# Patient Record
Sex: Female | Born: 1970 | Race: White | Hispanic: No | Marital: Married | Smoking: Current every day smoker
Health system: Southern US, Community
[De-identification: ages and names within clinical notes are randomized; demographics above are authoritative.]

## PROBLEM LIST (undated history)

## (undated) DIAGNOSIS — M797 Fibromyalgia: Secondary | ICD-10-CM

## (undated) DIAGNOSIS — F3181 Bipolar II disorder: Secondary | ICD-10-CM

## (undated) DIAGNOSIS — J381 Polyp of vocal cord and larynx: Secondary | ICD-10-CM

## (undated) HISTORY — DX: Polyp of vocal cord and larynx: J38.1

## (undated) HISTORY — DX: Bipolar II disorder: F31.81

---

## 2010-05-09 ENCOUNTER — Encounter: Payer: Self-pay | Admitting: Emergency Medicine

## 2010-05-09 ENCOUNTER — Inpatient Hospital Stay (INDEPENDENT_AMBULATORY_CARE_PROVIDER_SITE_OTHER)
Admission: RE | Admit: 2010-05-09 | Discharge: 2010-05-09 | Disposition: A | Discharge status: Home / Self Care | Payer: Self-pay | Source: Ambulatory Visit | Attending: Emergency Medicine | Admitting: Emergency Medicine

## 2010-05-09 LAB — CONVERTED CEMR LAB: Rapid Strep: NEGATIVE

## 2010-05-13 NOTE — Assessment & Plan Note (Signed)
Summary: SORE THROAT/   Vital Signs:  Patient Profile:   40 Years Old Female CC:      sore throat, HA, ear pain x last night Height:     64.5 inches Weight:      152 pounds O2 Sat:      100 % O2 treatment:    Room Air Temp:     98.4 degrees F oral Pulse rate:   92 / minute Resp:     18 per minute BP sitting:   130 / 84  (left arm) Cuff size:   regular  Vitals Entered By: Lajean Saver RN (May 09, 2010 3:49 PM)                  Updated Prior Medication List: TRILEPTAL 600 MG TABS (OXCARBAZEPINE) once daily TOPAMAX 50 MG TABS (TOPIRAMATE)  TEMAZEPAM 15 MG CAPS (TEMAZEPAM) once daily CYMBALTA 60 MG CPEP (DULOXETINE HCL) once daily FLONASE 50 MCG/ACT SUSP (FLUTICASONE PROPIONATE)  PREDNISONE 20 MG TABS (PREDNISONE) 2 days left in dose  Current Allergies: ! COMPAZINE ! SULFAHistory of Present Illness Chief Complaint: sore throat, HA, ear pain x last night History of Present Illness: Pt complains of 1 days of sore throat, HA, L ear pain. Slight colored rhinnorrhea. Was tx'd w 10 days of amox for sinusitis, improved, finished abx 1 week ago. Her co-worker and son have + strep. No cough, No dyspnea. No chest pain. No wheezing.  No nausea No vomiting. ? + fever, No chills   REVIEW OF SYSTEMS Constitutional Symptoms      Denies fever, chills, night sweats, weight loss, weight gain, and fatigue.  Eyes       Denies change in vision, eye pain, eye discharge, glasses, contact lenses, and eye surgery. Ear/Nose/Throat/Mouth       Complains of ear pain and sore throat.      Denies hearing loss/aids, change in hearing, ear discharge, dizziness, frequent runny nose, frequent nose bleeds, sinus problems, hoarseness, and tooth pain or bleeding.  Respiratory       Denies dry cough, productive cough, wheezing, shortness of breath, asthma, bronchitis, and emphysema/COPD.  Cardiovascular       Denies murmurs, chest pain, and tires easily with exhertion.    Gastrointestinal  Denies stomach pain, nausea/vomiting, diarrhea, constipation, blood in bowel movements, and indigestion. Genitourniary       Denies painful urination, kidney stones, and loss of urinary control. Neurological       Complains of headaches.      Denies paralysis, seizures, and fainting/blackouts. Musculoskeletal       Denies muscle pain, joint pain, joint stiffness, decreased range of motion, redness, swelling, muscle weakness, and gout.  Skin       Denies bruising, unusual mles/lumps or sores, and hair/skin or nail changes.  Psych       Denies mood changes, temper/anger issues, anxiety/stress, speech problems, depression, and sleep problems. Other Comments: recently recovered from sinus infection, finished antibiotics. C- worker and son with strep   Past History:  Past Medical History: ?pinched nerver in neck bi-polar disorder  Past Surgical History: Tonsillectomy  Family History: Family History Hypertension- mother  Social History: Current Smoker 1/3 PPD Alcohol use-yes 1/week Drug use-no Smoking Status:  current Drug Use:  no Physical Exam General appearance: fatigued, alert, nad Head: normocephalic, atraumatic. Mild R maxillary ttp. Eyes: conjunctivae and lids normal Ears: normal, no lesions or deformities Nasal: swollen red turbinates with congestion. mild deviated septum. mild seromucous d/c. Oral/Pharynx: pharyngeal  erythema without exudate, uvula midline without deviation Neck: supple,anterior lymphadenopathy present Chest/Lungs: no rales, wheezes, or rhonchi bilateral, breath sounds equal without effort Heart: regular rate and  rhythm, no murmur Skin: no obvious rashes or lesions Assessment New Problems: UPPER RESPIRATORY INFECTION, ACUTE (ICD-465.9) PHARYNGITIS, ACUTE (ICD-462.0)   Patient Education: Patient and/or caregiver instructed in the following: rest, fluids, Tylenol prn.  Plan New Medications/Changes: AUGMENTIN 875-125 MG TABS (AMOXICILLIN-POT  CLAVULANATE) 1 by mouth two times a day x 10 days  #20 x 0, 05/09/2010, Lajean Manes MD  Planning Comments:   Discussed that this could be viral cause. Pt declined sending off throat cx. Risks, benefits, alternatives discussed. Pt voiced understanding and agreement. --I wrote for augmentin rx, to fill if not improving with sxs care in 3 days. (fill sooner if spiking fever of worsening colored rhinorrhea)  Follow Up: Follow up in 2-3 days if no improvement, Follow up with Primary Physician  The patient and/or caregiver has been counseled thoroughly with regard to medications prescribed including dosage, schedule, interactions, rationale for use, and possible side effects and they verbalize understanding.  Diagnoses and expected course of recovery discussed and will return if not improved as expected or if the condition worsens. Patient and/or caregiver verbalized understanding.  Prescriptions: AUGMENTIN 875-125 MG TABS (AMOXICILLIN-POT CLAVULANATE) 1 by mouth two times a day x 10 days  #20 x 0   Entered and Authorized by:   Lajean Manes MD   Signed by:   Lajean Manes MD on 05/09/2010   Method used:   Handwritten   RxID:   [id number]     Laboratory Results  Date/Time Received: May 09, 2010 3:56 PM  Date/Time Reported: May 09, 2010 3:56 PM   Other Tests  Rapid Strep: negative  Kit Test Internal QC: Negative   (Normal Range: Negative)

## 2010-05-16 ENCOUNTER — Telehealth (INDEPENDENT_AMBULATORY_CARE_PROVIDER_SITE_OTHER): Payer: Self-pay | Admitting: *Deleted

## 2010-05-22 NOTE — Progress Notes (Signed)
  Phone Note Outgoing Call Call back at [facility] Phone [phone]   Call placed by: Emilio Math,  May 16, 2010 2:05 PM Call placed to: Patient Summary of Call: Left msg, hope you're feeling better appreciate you coming in.

## 2010-06-30 ENCOUNTER — Encounter: Payer: Self-pay | Admitting: Emergency Medicine

## 2010-06-30 ENCOUNTER — Inpatient Hospital Stay (INDEPENDENT_AMBULATORY_CARE_PROVIDER_SITE_OTHER)
Admission: RE | Admit: 2010-06-30 | Discharge: 2010-06-30 | Disposition: A | Discharge status: Home / Self Care | Payer: Self-pay | Source: Ambulatory Visit | Attending: Emergency Medicine | Admitting: Emergency Medicine

## 2010-06-30 DIAGNOSIS — H60399 Other infective otitis externa, unspecified ear: Secondary | ICD-10-CM

## 2010-07-08 ENCOUNTER — Telehealth (INDEPENDENT_AMBULATORY_CARE_PROVIDER_SITE_OTHER): Payer: Self-pay | Admitting: *Deleted

## 2010-07-10 ENCOUNTER — Telehealth (INDEPENDENT_AMBULATORY_CARE_PROVIDER_SITE_OTHER): Payer: Self-pay | Admitting: *Deleted

## 2010-09-09 ENCOUNTER — Inpatient Hospital Stay (INDEPENDENT_AMBULATORY_CARE_PROVIDER_SITE_OTHER)
Admission: RE | Admit: 2010-09-09 | Discharge: 2010-09-09 | Disposition: A | Discharge status: Home / Self Care | Payer: Self-pay | Source: Ambulatory Visit | Attending: Emergency Medicine | Admitting: Emergency Medicine

## 2010-09-09 ENCOUNTER — Encounter: Payer: Self-pay | Admitting: Emergency Medicine

## 2010-09-09 DIAGNOSIS — B370 Candidal stomatitis: Secondary | ICD-10-CM | POA: Insufficient documentation

## 2010-11-18 ENCOUNTER — Inpatient Hospital Stay (INDEPENDENT_AMBULATORY_CARE_PROVIDER_SITE_OTHER)
Admission: RE | Admit: 2010-11-18 | Discharge: 2010-11-18 | Disposition: A | Discharge status: Home / Self Care | Payer: Self-pay | Source: Ambulatory Visit | Attending: Family Medicine | Admitting: Family Medicine

## 2010-11-18 ENCOUNTER — Encounter: Payer: Self-pay | Admitting: Family Medicine

## 2010-11-18 DIAGNOSIS — J0100 Acute maxillary sinusitis, unspecified: Secondary | ICD-10-CM

## 2010-11-23 ENCOUNTER — Telehealth (INDEPENDENT_AMBULATORY_CARE_PROVIDER_SITE_OTHER): Payer: Self-pay | Admitting: Emergency Medicine

## 2011-01-26 NOTE — Telephone Encounter (Signed)
  Phone Note Call from Patient   Caller: Patient Summary of Call: Patient reports she took the Diflucan and is still having no relief. One additional dose of Diflucan and one tube of Nystatin cream, per Dr. Orson Aloe, called into [facility] in [place]. Initial call taken by: Lajean Saver RN,  Jul 10, 2010 8:24 AM

## 2011-01-26 NOTE — Progress Notes (Signed)
Summary: thrush   Vital Signs:  Patient Profile:   40 Years Old Female CC:      thrush x 1 day Height:     64.5 inches Weight:      142 pounds O2 Sat:      99 % O2 treatment:    Room Air Temp:     98.4 degrees F oral Pulse rate:   81 / minute Pulse rhythm:   regular Resp:     16 per minute BP sitting:   113 / 73  (left arm) Cuff size:   regular  Vitals Entered By: Emilio Math (September 09, 2010 4:37 PM)                  Current Allergies (reviewed today): ! COMPAZINE ! SULFA ! * LAMICTALHistory of Present Illness History from: patient Chief Complaint: thrush History of Present Illness: She had thrush 2 months ago that was difficult to treat and she had to take 2 rounds of Nystatin. It started after she was placed on an inhaled steroid a few months ago.  She reports being completely healthy (other than bipolar) with no immunodeficiencies.  She has white spots on her tongue and inside her mouth and a sore throat.  She feels that her throat and stomach are affected and she would like antifungal treatment, but doesn't want Nystatin again because it doesn't work well with her.  She is uninsured and states that she cannot afford to see an ENT right now.  No F/C/N/V or other symptoms.  She has cut out sugar from her diet and doesn't want any meds that have sugar in it.  REVIEW OF SYSTEMS Constitutional Symptoms      Denies fever, chills, night sweats, weight loss, weight gain, and fatigue.  Eyes       Denies change in vision, eye pain, eye discharge, glasses, contact lenses, and eye surgery. Ear/Nose/Throat/Mouth       Complains of sore throat.      Denies hearing loss/aids, change in hearing, ear pain, ear discharge, dizziness, frequent runny nose, frequent nose bleeds, sinus problems, hoarseness, and tooth pain or bleeding.  Respiratory       Denies dry cough, productive cough, wheezing, shortness of breath, asthma, bronchitis, and emphysema/COPD.  Cardiovascular       Denies  murmurs, chest pain, and tires easily with exhertion.    Gastrointestinal       Denies stomach pain, nausea/vomiting, diarrhea, constipation, blood in bowel movements, and indigestion. Genitourniary       Denies painful urination, kidney stones, and loss of urinary control. Neurological       Denies paralysis, seizures, and fainting/blackouts. Musculoskeletal       Denies muscle pain, joint pain, joint stiffness, decreased range of motion, redness, swelling, muscle weakness, and gout.  Skin       Denies bruising, unusual mles/lumps or sores, and hair/skin or nail changes.  Psych       Denies mood changes, temper/anger issues, anxiety/stress, speech problems, depression, and sleep problems.  Past History:  Past Medical History: Reviewed history from 05/09/2010 and no changes required. ?pinched nerver in neck bi-polar disorder  Past Surgical History: Reviewed history from 05/09/2010 and no changes required. Tonsillectomy  Family History: Reviewed history from 05/09/2010 and no changes required. Family History Hypertension- mother  Social History: Reviewed history from 05/09/2010 and no changes required. Current Smoker 1/3 PPD Alcohol use-yes 1/week Drug use-no Physical Exam General appearance: well developed, well nourished, no acute  distress Ears: normal, no lesions or deformities Nasal: mucosa pink, nonedematous, no septal deviation, turbinates normal Oral/Pharynx: OP is normal with no erythema or exudate or swelling, she does have a white coating on the top of her tongue but no tongue swelling Neck: neck supple,  trachea midline, no masses Chest/Lungs: no rales, wheezes, or rhonchi bilateral, breath sounds equal without effort Heart: regular rate and  rhythm, no murmur MSE: oriented to time, place, and person Assessment New Problems: THRUSH (ICD-112.0)   Plan New Medications/Changes: FLUCONAZOLE IN SODIUM CHLORIDE 200-0.9 MG/100ML-% SOLN (FLUCONAZOLE IN SODIUM  CHLORIDE) 200mg  on day 1, then 100mg  daily for 3 weeks  #QS x 0, 09/09/2010, Hoyt Koch MD  New Orders: Est. Patient Level II [phone] Planning Comments:   I have educated the patient that this may or may not be thrush, but if so, the question of why she is getting it is important.  I would like her to see her PCP and consider labwork (for underlying medical problems and liver function) since they can likely manage this better or discussing with an ID physician if she is so inclined.  I have Rx Diflucan suspension that she can swish and spit for a few weeks.  She has also voiced that she may want to go see a wellness naturopath that can help her with diet or other remedies.    DDx includes many other viral infections, etc.    The patient and/or caregiver has been counseled thoroughly with regard to medications prescribed including dosage, schedule, interactions, rationale for use, and possible side effects and they verbalize understanding.  Diagnoses and expected course of recovery discussed and will return if not improved as expected or if the condition worsens. Patient and/or caregiver verbalized understanding.  Prescriptions: FLUCONAZOLE IN SODIUM CHLORIDE 200-0.9 MG/100ML-% SOLN (FLUCONAZOLE IN SODIUM CHLORIDE) 200mg  on day 1, then 100mg  daily for 3 weeks  #QS x 0   Entered and Authorized by:   Hoyt Koch MD   Signed by:   Hoyt Koch MD on 09/09/2010   Method used:   Print then Give to Patient   RxID:   [id number]   Orders Added: 1)  Est. Patient Level II [08657]

## 2011-01-26 NOTE — Progress Notes (Signed)
Summary: possible sinus infection rm 4   Vital Signs:  Patient Profile:   40 Years Old Female CC:      sinus problems x 9 days  Height:     64.5 inches Weight:      139.50 pounds O2 Sat:      100 % O2 treatment:    Room Air Temp:     98.2 degrees F oral Pulse rate:   74 / minute Resp:     16 per minute BP sitting:   108 / 72  (left arm) Cuff size:   regular  Vitals Entered By: Clemens Catholic LPN (November 18, 2010 4:05 PM)                  Updated Prior Medication List: TRILEPTAL 600 MG TABS (OXCARBAZEPINE) once daily TOPAMAX 50 MG TABS (TOPIRAMATE)  TEMAZEPAM 15 MG CAPS (TEMAZEPAM) once daily CYMBALTA 60 MG CPEP (DULOXETINE HCL) once daily FLONASE 50 MCG/ACT SUSP (FLUTICASONE PROPIONATE)   Current Allergies (reviewed today): ! COMPAZINE ! SULFA ! * LAMICTALHistory of Present Illness Chief Complaint: sinus problems x 9 days  History of Present Illness:  Subjective:  Patient complains of a history of recurring right side sinus infections, usually occuring about once or twice a year.  Last infection about 9 months ago.  She has now had 8 day history of constant right facial pressure and nasal congestion.  No fevers, chills, and sweats.  No recent URI symptoms.  + allergy to mold.  She had a brief left nosebleed today. She has been using saline irrigation with a neti pot, Sudafed, antihistamines, and started back on Flonase about 4 days ago.  She has also used Afrin at bedtime.  REVIEW OF SYSTEMS Constitutional Symptoms       Complains of fatigue.     Denies fever, chills, night sweats, weight loss, and weight gain.  Eyes       Complains of eye pain and eye drainage.      Denies change in vision, glasses, contact lenses, and eye surgery. Ear/Nose/Throat/Mouth       Complains of frequent runny nose and sinus problems.      Denies hearing loss/aids, change in hearing, ear pain, ear discharge, dizziness, frequent nose bleeds, sore throat, hoarseness, and tooth pain or  bleeding.  Respiratory       Denies dry cough, productive cough, wheezing, shortness of breath, asthma, bronchitis, and emphysema/COPD.  Cardiovascular       Denies murmurs, chest pain, and tires easily with exhertion.    Gastrointestinal       Denies stomach pain, nausea/vomiting, diarrhea, constipation, blood in bowel movements, and indigestion. Genitourniary       Denies painful urination, kidney stones, and loss of urinary control. Neurological       Complains of headaches.      Denies paralysis, seizures, and fainting/blackouts. Musculoskeletal       Denies muscle pain, joint pain, joint stiffness, decreased range of motion, redness, swelling, muscle weakness, and gout.  Skin       Denies bruising, unusual mles/lumps or sores, and hair/skin or nail changes.  Psych       Denies mood changes, temper/anger issues, anxiety/stress, speech problems, depression, and sleep problems. Other Comments: pt c/o sinus problems, HA, RT sided head pressure, and fatigue x 9 days. she has a hx of sinusitis. she has tried Coca Cola pot, Mucinex, Sudafed, and Benadryl with no relief.   Past History:  Past Medical History: Reviewed history  from 05/09/2010 and no changes required. ?pinched nerver in neck bi-polar disorder  Past Surgical History: Reviewed history from 05/09/2010 and no changes required. Tonsillectomy  Family History: Reviewed history from 05/09/2010 and no changes required. Family History Hypertension- mother  Social History: Reviewed history from 05/09/2010 and no changes required. Current Smoker 1/3 PPD Alcohol use-yes 1/week Drug use-no   Objective:  Appearance:  Patient appears healthy, stated age, and in no acute distress  Eyes:  Pupils are equal, round, and reactive to light and accomodation.  Extraocular movement is intact.  Conjunctivae are not inflamed.  Ears:  Canals normal.  Tympanic membranes normal.   Nose:  Moderately congested turbinates.  No sinus tenderness  however Pharynx:  Normal  Neck:  Supple.  Slightly tender shotty anterior/posterior nodes are palpated bilaterally.  Lungs:  Clear to auscultation.  Breath sounds are equal.  Heart:  Regular rate and rhythm without murmurs, rubs, or gallops.   Assessment New Problems: SINUSITIS, MAXILLARY, ACUTE (ICD-461.0)  ? SINUSITIS  Plan New Medications/Changes: DOXYCYCLINE HYCLATE 100 MG CAPS (DOXYCYCLINE HYCLATE) One by mouth two times a day  #28 x 0, 11/18/2010, Donna Christen MD  New Orders: Est. Patient Level III [phone] Planning Comments:   Patient declines sinus x-rays (no insurance).  She also prefers not to take oral steroid. Begin doxycycline for 10 to 14 days, expectorant/decongestant.  May use Afrin once or twice daily followed by saline irrigation, then Flonase once daily.  Increase fluid intake Followup with ENT if not improving 10 to 14 days.   The patient and/or caregiver has been counseled thoroughly with regard to medications prescribed including dosage, schedule, interactions, rationale for use, and possible side effects and they verbalize understanding.  Diagnoses and expected course of recovery discussed and will return if not improved as expected or if the condition worsens. Patient and/or caregiver verbalized understanding.  Prescriptions: DOXYCYCLINE HYCLATE 100 MG CAPS (DOXYCYCLINE HYCLATE) One by mouth two times a day  #28 x 0   Entered and Authorized by:   Donna Christen MD   Signed by:   Donna Christen MD on 11/18/2010   Method used:   Print then Give to Patient   RxID:   [id number]   Patient Instructions: 1)  Take Mucinex  (guaifenesin) twice daily for congestion with Sudafed. 2)  Increase fluid intake  3)  May use Afrin nasal spray (or generic oxymetazoline) twice daily for about 5 days.  Also recommend using saline nasal spray several times daily and/or saline nasal irrigation.  Continue Flonase. 4)  Followup with ENT physician if not improving 10 to 14 days.    Orders Added: 1)  Est. Patient Level III [[id number]]

## 2011-01-26 NOTE — Telephone Encounter (Signed)
  Phone Note Call from Patient   Caller: Patient Summary of Call: Called to say S&S of sinus infx improving, but wondering if RX for Doxycycline could be causing her extreme weakness, shakiness, pain in hips and low back? Reviewed with Dr.Beese who advises quit RX; if no improvement after 24 hours, seek care in ER. Pt. understands. Initial call taken by: Lavell Islam RN,  November 23, 2010 2:53 PM

## 2011-01-26 NOTE — Telephone Encounter (Signed)
  Phone Note Call from Patient Call back at Home Phone [phone]   Caller: Patient Call For: Rx Summary of Call: Patient developed a yeast infection (3 days) from the antibiotic she has been taking. She has tried vagisil without relief. She would like a Rx for Diflucan. [facility] [place].  Initial call taken by: Lajean Saver RN,  Jul 08, 2010 9:25 AM    New/Updated Medications: DIFLUCAN 150 MG TABS (FLUCONAZOLE) One by mouth as a single dose Prescriptions: DIFLUCAN 150 MG TABS (FLUCONAZOLE) One by mouth as a single dose  #1 x 0   Entered and Authorized by:   Donna Christen MD   Signed by:   Donna Christen MD on 07/08/2010   Method used:   Electronically to        [facility] [address] [phone]* (retail)       [address]       [place], [place]  [address]       Ph: [phone]       Fax: [phone]   RxID:   [id number]  Rx sent Donna Christen MD  Jul 08, 2010 9:57 AM

## 2011-01-26 NOTE — Progress Notes (Signed)
Summary: Ear Pain Room 5   Vital Signs:  Patient Profile:   40 Years Old Female CC:      Left ear pain x 2 wks Height:     64.5 inches Weight:      154 pounds O2 Sat:      100 % O2 treatment:    Room Air Temp:     99.3 degrees F oral Pulse rate:   91 / minute Pulse rhythm:   regular Resp:     16 per minute BP sitting:   112 / 73  (left arm) Cuff size:   regular  Vitals Entered By: Emilio Math (Jun 30, 2010 10:39 AM)                  Current Allergies: ! COMPAZINE ! SULFA ! * LAMICTALHistory of Present Illness Chief Complaint: Left ear pain x 2 wks History of Present Illness: L ear pain for 2 weeks.  Also R ear is hurting a little bit too. Feels itchy, irritating.  Uses Qtips daily.  Had a Rx for Augmentin that she has been taking for 5 days with no help.  Laying down and heat helps.  Current Meds TRILEPTAL 600 MG TABS (OXCARBAZEPINE) once daily TOPAMAX 50 MG TABS (TOPIRAMATE)  TEMAZEPAM 15 MG CAPS (TEMAZEPAM) once daily CYMBALTA 60 MG CPEP (DULOXETINE HCL) once daily FLONASE 50 MCG/ACT SUSP (FLUTICASONE PROPIONATE)  AUGMENTIN 875-125 MG TABS (AMOXICILLIN-POT CLAVULANATE) 1 by mouth two times a day x 10 days CORTISPORIN 3.5-10000-1 SOLN (NEOMYCIN-POLYMYXIN-HC) 4 drops both ears for a week  REVIEW OF SYSTEMS Constitutional Symptoms      Denies fever, chills, night sweats, weight loss, weight gain, and fatigue.  Eyes       Denies change in vision, eye pain, eye discharge, glasses, contact lenses, and eye surgery. Ear/Nose/Throat/Mouth       Complains of ear pain.      Denies hearing loss/aids, change in hearing, ear discharge, dizziness, frequent runny nose, frequent nose bleeds, sinus problems, sore throat, hoarseness, and tooth pain or bleeding.  Respiratory       Denies dry cough, productive cough, wheezing, shortness of breath, asthma, bronchitis, and emphysema/COPD.  Cardiovascular       Denies murmurs, chest pain, and tires easily with exhertion.     Gastrointestinal       Denies stomach pain, nausea/vomiting, diarrhea, constipation, blood in bowel movements, and indigestion. Genitourniary       Denies painful urination, kidney stones, and loss of urinary control. Neurological       Denies paralysis, seizures, and fainting/blackouts. Musculoskeletal       Denies muscle pain, joint pain, joint stiffness, decreased range of motion, redness, swelling, muscle weakness, and gout.  Skin       Denies bruising, unusual mles/lumps or sores, and hair/skin or nail changes.  Psych       Denies mood changes, temper/anger issues, anxiety/stress, speech problems, depression, and sleep problems.  Past History:  Past Medical History: Reviewed history from 05/09/2010 and no changes required. ?pinched nerver in neck bi-polar disorder  Past Surgical History: Reviewed history from 05/09/2010 and no changes required. Tonsillectomy  Family History: Reviewed history from 05/09/2010 and no changes required. Family History Hypertension- mother Physical Exam General appearance: well developed, well nourished, no acute distress Ears: normal, no lesions or deformities Nasal: mucosa pink, nonedematous, no septal deviation, turbinates normal Oral/Pharynx: tongue normal, posterior pharynx without erythema or exudate Chest/Lungs: no rales, wheezes, or rhonchi bilateral, breath sounds  equal without effort Heart: regular rate and  rhythm, no murmur MSE: oriented to time, place, and person Assessment New Problems: OTITIS EXTERNA (ICD-380.10)   Plan New Medications/Changes: CORTISPORIN 3.5-10000-1 SOLN (NEOMYCIN-POLYMYXIN-HC) 4 drops both ears for a week  #QS x 0, 06/30/2010, Hoyt Koch MD  New Orders: Est. Patient Level I [82956] Planning Comments:   Already on Augmentin so would continue that.  Will add Cortisporin Otic for OE.  Avoid Qtips this week. Advised on medication use.  If not improving, will refer to ENT.  I don't see any lesions  or a cause for her discomfort, but it sounds like otitis externa. Call back if not improving.   The patient and/or caregiver has been counseled thoroughly with regard to medications prescribed including dosage, schedule, interactions, rationale for use, and possible side effects and they verbalize understanding.  Diagnoses and expected course of recovery discussed and will return if not improved as expected or if the condition worsens. Patient and/or caregiver verbalized understanding.  Prescriptions: CORTISPORIN 3.5-10000-1 SOLN (NEOMYCIN-POLYMYXIN-HC) 4 drops both ears for a week  #QS x 0   Entered and Authorized by:   Hoyt Koch MD   Signed by:   Hoyt Koch MD on 06/30/2010   Method used:   Print then Give to Patient   RxID:   [id number]   Orders Added: 1)  Est. Patient Level I [[id number]]

## 2013-02-02 ENCOUNTER — Encounter: Payer: Self-pay | Admitting: Emergency Medicine

## 2013-02-02 ENCOUNTER — Emergency Department
Admission: EM | Admit: 2013-02-02 | Discharge: 2013-02-02 | Disposition: A | Discharge status: Home / Self Care | Payer: Self-pay | Source: Home / Self Care | Attending: Emergency Medicine | Admitting: Emergency Medicine

## 2013-02-02 DIAGNOSIS — N390 Urinary tract infection, site not specified: Secondary | ICD-10-CM

## 2013-02-02 HISTORY — DX: Fibromyalgia: M79.7

## 2013-02-02 LAB — POCT URINALYSIS DIP (MANUAL ENTRY)
Glucose, UA: 100
Nitrite, UA: POSITIVE
Protein Ur, POC: 100
Spec Grav, UA: 1.02 (ref 1.005–1.03)
Urobilinogen, UA: 2 (ref 0–1)
pH, UA: 6 (ref 5–8)

## 2013-02-02 MED ORDER — FLUCONAZOLE 150 MG PO TABS: 150.0000 mg | ORAL_TABLET | Freq: Once | ORAL | Status: DC

## 2013-02-02 MED ORDER — CIPROFLOXACIN HCL 500 MG PO TABS: 500.0000 mg | ORAL_TABLET | Freq: Two times a day (BID) | ORAL | Status: DC

## 2013-02-02 NOTE — ED Notes (Signed)
Emonni c/o dysuria, bladder pain x 4 days. Diarrhea started a couple days ago. Denies fever or chills. Taken [employer] OTC.

## 2013-02-02 NOTE — ED Provider Notes (Signed)
CSN: [id number]     Arrival date & time 02/02/13  0825 History   First MD Initiated Contact with Patient 02/02/13 [phone]     Chief Complaint  Patient presents with  . Urinary Tract Infection   Monisha c/o dysuria, bladder pain x 4 days. Diarrhea started a couple days ago. Denies fever or chills. Marland Kitchen   HPI This is a 42 y.o. female who presents today with UTI symptoms for 4 days.  + dysuria + frequency + urgency No hematuria No vaginal discharge No fever/chills + lower abdominal pain No nausea No vomiting Had one loose semi-formed stool 2 days ago, which resolved. No blood or mucus. No back pain No fatigue She denies chance of pregnancy.--Last normal menstrual period 01/16/13 Has tried over-the-counter uristat  without improvement.  She states that she gets these UTIs recurrently after having intercourse.   Past Medical History  Diagnosis Date  . Fibromyalgia    History reviewed. No pertinent past surgical history. Family History  Problem Relation Age of Onset  . Hypertension Mother   . Cancer Father     prostate CA   History  Substance Use Topics  . Smoking status: Current Every Day Smoker  . Smokeless tobacco: Not on file  . Alcohol Use: Yes   OB History   Grav Para Term Preterm Abortions TAB SAB Ect Mult Living                 Review of Systems  All other systems reviewed and are negative.    Allergies  Lamotrigine; Prochlorperazine edisylate; and Sulfonamide derivatives  Home Medications   Current Outpatient Rx  Name  Route  Sig  Dispense  Refill  . DULoxetine (CYMBALTA) 60 MG capsule   Oral   Take 60 mg by mouth daily.         Marland Kitchen gabapentin (NEURONTIN) 300 MG capsule   Oral   Take 300 mg by mouth 3 (three) times daily.         . Oxcarbazepine (TRILEPTAL) 300 MG tablet   Oral   Take 300 mg by mouth 2 (two) times daily.         . temazepam (RESTORIL) 15 MG capsule   Oral   Take 15 mg by mouth at bedtime as needed for sleep.         .  ciprofloxacin (CIPRO) 500 MG tablet   Oral   Take 1 tablet (500 mg total) by mouth 2 (two) times daily. Take for 10 days.   20 tablet   1   . fluconazole (DIFLUCAN) 150 MG tablet   Oral   Take 1 tablet (150 mg total) by mouth once. Take 1 now, then may repeat x1 in 4 days, for yeast infection.   2 tablet   1    BP 112/73  Pulse 90  Temp(Src) 97.9 F (36.6 C) (Oral)  Resp 14  Ht 5' 4.75" (1.645 m)  Wt 142 lb (64.411 kg)  BMI 23.80 kg/m2  SpO2 100%  LMP 01/16/2013 Physical Exam  Nursing note and vitals reviewed. Constitutional: She is oriented to person, place, and time. She appears well-developed and well-nourished. No distress.  HENT:  Mouth/Throat: Oropharynx is clear and moist.  Eyes: No scleral icterus.  Neck: Neck supple.  Cardiovascular: Normal rate, regular rhythm and normal heart sounds.   Pulmonary/Chest: Breath sounds normal.  Abdominal: Soft. She exhibits no mass. There is no hepatosplenomegaly. There is tenderness in the suprapubic area. There is no rebound, no  guarding and no CVA tenderness.  Lymphadenopathy:    She has no cervical adenopathy.  Neurological: She is alert and oriented to person, place, and time.  Skin: Skin is warm and dry.    ED Course  Procedures (including critical care time) Labs Review Labs Reviewed  URINE CULTURE  POCT URINALYSIS DIP (MANUAL ENTRY)   Imaging Review No results found.  EKG Interpretation    Date/Time:    Ventricular Rate:    PR Interval:    QRS Duration:   QT Interval:    QTC Calculation:   R Axis:     Text Interpretation:              MDM   1. UTI (urinary tract infection)    Risks, benefits, alternatives discussed Send for urine culture. Begin Cipro 500 mg twice a day At her request, Diflucan prescribed in case she gets yeast infection from the antibiotic. In the future, may try taking 1 Cipro after  Intercourse to help prevent UTI. Followup with PCP or GYN if no better 7-10 days, sooner if  worse. I explained that if she continues to have frequent UTIs, her PCP or GYN should refer her to urologist. Precautions discussed. Red flags discussed. Questions invited and answered. Patient voiced understanding and agreement.    Lajean Manes, MD 02/02/13 1003

## 2013-02-04 LAB — URINE CULTURE: Colony Count: >=100000

## 2013-05-26 ENCOUNTER — Encounter: Payer: Self-pay | Admitting: Emergency Medicine

## 2013-05-26 ENCOUNTER — Emergency Department (INDEPENDENT_AMBULATORY_CARE_PROVIDER_SITE_OTHER): Payer: Self-pay

## 2013-05-26 ENCOUNTER — Emergency Department (INDEPENDENT_AMBULATORY_CARE_PROVIDER_SITE_OTHER)
Admission: EM | Admit: 2013-05-26 | Discharge: 2013-05-26 | Disposition: A | Discharge status: Home / Self Care | Payer: Self-pay | Source: Home / Self Care | Attending: Family Medicine | Admitting: Family Medicine

## 2013-05-26 DIAGNOSIS — J209 Acute bronchitis, unspecified: Secondary | ICD-10-CM

## 2013-05-26 DIAGNOSIS — R059 Cough, unspecified: Secondary | ICD-10-CM

## 2013-05-26 DIAGNOSIS — R0781 Pleurodynia: Secondary | ICD-10-CM

## 2013-05-26 DIAGNOSIS — R05 Cough: Secondary | ICD-10-CM

## 2013-05-26 DIAGNOSIS — R509 Fever, unspecified: Secondary | ICD-10-CM

## 2013-05-26 DIAGNOSIS — J3489 Other specified disorders of nose and nasal sinuses: Secondary | ICD-10-CM

## 2013-05-26 DIAGNOSIS — R079 Chest pain, unspecified: Secondary | ICD-10-CM

## 2013-05-26 DIAGNOSIS — R071 Chest pain on breathing: Secondary | ICD-10-CM

## 2013-05-26 LAB — POCT CBC W AUTO DIFF (K'VILLE URGENT CARE)

## 2013-05-26 MED ORDER — AZITHROMYCIN 250 MG PO TABS: ORAL_TABLET | ORAL | Status: DC

## 2013-05-26 MED ORDER — FLUCONAZOLE 150 MG PO TABS: 150.0000 mg | ORAL_TABLET | Freq: Once | ORAL | Status: DC

## 2013-05-26 MED ORDER — BENZONATATE 200 MG PO CAPS: 200.0000 mg | ORAL_CAPSULE | Freq: Every day | ORAL | Status: DC

## 2013-05-26 NOTE — ED Notes (Signed)
Pt c/o right flank pain, SOB, cough states she cannot take deep breathe.

## 2013-05-26 NOTE — Discharge Instructions (Signed)
Take plain Mucinex (1200 mg guaifenesin) twice daily for cough and congestion.  May add Sudafed for sinus congestion.   Increase fluid intake, rest. May use Afrin nasal spray (or generic oxymetazoline) twice daily for about 5 days.  Also recommend using saline nasal spray several times daily and saline nasal irrigation (AYR is a common brand) Try warm salt water gargles for sore throat.  Stop all antihistamines for now, and other non-prescription cough/cold preparations. May take Ibuprofen 200mg , 4 tabs every 8 hours with food for right side chest pain (call if rash develops)   Follow-up with family doctor if not improving 7 to 10 days.

## 2013-05-26 NOTE — ED Provider Notes (Signed)
CSN: [id number]     Arrival date & time 05/26/13  1053 History   First MD Initiated Contact with Patient 05/26/13 1133     Chief Complaint  Patient presents with  . Chest Pain    rib pain      HPI Comments: One week ago patient developed sore throat, nasal congestion, myalgias, fatigue, and sweats/chills.  Four days ago she developed a cough, and last night she developed sharp pain in her right lateral chest whenever she coughed.  The history is provided by the patient.    Past Medical History  Diagnosis Date  . Fibromyalgia    History reviewed. No pertinent past surgical history. Family History  Problem Relation Age of Onset  . Hypertension Mother   . Cancer Father     prostate CA   History  Substance Use Topics  . Smoking status: Current Every Day Smoker  . Smokeless tobacco: Not on file  . Alcohol Use: Yes   OB History   Grav Para Term Preterm Abortions TAB SAB Ect Mult Living                 Review of Systems + sore throat + cough + pleuritic pain No wheezing + nasal congestion + post-nasal drainage No sinus pain/pressure No itchy/red eyes No earache No hemoptysis + SOB + fever, + chills No nausea No vomiting No abdominal pain No diarrhea No urinary symptoms No skin rash + fatigue + myalgias No headache Used OTC meds without relief  Allergies  Lamotrigine; Prochlorperazine edisylate; and Sulfonamide derivatives  Home Medications   Current Outpatient Rx  Name  Route  Sig  Dispense  Refill  . azithromycin (ZITHROMAX Z-PAK) 250 MG tablet      Take 2 tabs today; then begin one tab once daily for 4 more days.   6 each   0   . benzonatate (TESSALON) 200 MG capsule   Oral   Take 1 capsule (200 mg total) by mouth at bedtime. Take as needed for cough   12 capsule   0   . DULoxetine (CYMBALTA) 60 MG capsule   Oral   Take 60 mg by mouth daily.         . fluconazole (DIFLUCAN) 150 MG tablet   Oral   Take 1 tablet (150 mg total) by mouth  once. Take 1 now, then may repeat x1 in 4 days, for yeast infection.   2 tablet   1   . gabapentin (NEURONTIN) 300 MG capsule   Oral   Take 300 mg by mouth 3 (three) times daily.         . Oxcarbazepine (TRILEPTAL) 300 MG tablet   Oral   Take 300 mg by mouth 2 (two) times daily.         . temazepam (RESTORIL) 15 MG capsule   Oral   Take 15 mg by mouth at bedtime as needed for sleep.          BP 113/75  Pulse 89  Temp(Src) 98 F (36.7 C) (Oral)  Resp 20  Ht 5' 4.75" (1.645 m)  Wt 147 lb 12 oz (67.019 kg)  BMI 24.77 kg/m2  SpO2 99% Physical Exam  Nursing note and vitals reviewed. Constitutional: She is oriented to person, place, and time. She appears well-developed and well-nourished. No distress.  HENT:  Head: Normocephalic.  Nose: Nose normal.  Mouth/Throat: Oropharynx is clear and moist.  Eyes: Conjunctivae are normal. Pupils are equal, round, and reactive to  light. Right eye exhibits no discharge. Left eye exhibits no discharge.  Neck: Neck supple.  Enlarged tender posterior cervical nodes are palpated  Cardiovascular: Normal rate, regular rhythm and normal heart sounds.   Pulmonary/Chest: Effort normal and breath sounds normal. She has no wheezes. She has no rales.   She exhibits tenderness.    Patient has pleuritic pain distributed over right chest as noted on diagram.  Abdominal: There is no tenderness.  Musculoskeletal: She exhibits no edema.  Lymphadenopathy:    She has cervical adenopathy.  Neurological: She is alert and oriented to person, place, and time.  Skin: Skin is warm and dry. No rash noted.    ED Course  Procedures  none    Labs Reviewed  POCT CBC W AUTO DIFF ([facility]):  WBC 13,0; LY 10.6; MO 8.6; GR 80.8; Hgb 12.3; Platelets 221    Imaging Review Dg Chest 2 View  05/26/2013   CLINICAL DATA:  Cough, fever, congestion for 1 week. Right-sided rib pain since last night with deep breathing.  EXAM: CHEST  2 VIEW  COMPARISON:   None.  FINDINGS: The heart size and mediastinal contours are within normal limits. Both lungs are clear. The visualized skeletal structures are unremarkable.  IMPRESSION: No active cardiopulmonary disease.   Electronically Signed   By: Rosalie Gums M.D.   On: 05/26/2013 11:57     MDM   1. Acute bronchitis; note  Leukocytosis 13.0  Pleuritic Chest Pain   Begin Z-pack to cover atypicals.  Prescription written for Benzonatate [facility]) to take at bedtime for night-time cough.  Take plain Mucinex (1200 mg guaifenesin) twice daily for cough and congestion.  May add Sudafed for sinus congestion.   Increase fluid intake, rest. May use Afrin nasal spray (or generic oxymetazoline) twice daily for about 5 days.  Also recommend using saline nasal spray several times daily and saline nasal irrigation (AYR is a common brand) Try warm salt water gargles for sore throat.  Stop all antihistamines for now, and other non-prescription cough/cold preparations. May take Ibuprofen 200mg , 4 tabs every 8 hours with food for right side chest pain (call if rash develops)   Follow-up with family doctor if not improving 7 to 10 days.     Lattie Haw, MD 05/28/13 [phone]

## 2013-11-03 ENCOUNTER — Encounter: Payer: Self-pay | Admitting: Physician Assistant

## 2013-11-03 ENCOUNTER — Ambulatory Visit (INDEPENDENT_AMBULATORY_CARE_PROVIDER_SITE_OTHER): Payer: BC Managed Care – HMO | Admitting: Physician Assistant

## 2013-11-03 VITALS — BP 121/79 | HR 89 | Ht 64.75 in | Wt 146.0 lb

## 2013-11-03 DIAGNOSIS — IMO0001 Reserved for inherently not codable concepts without codable children: Secondary | ICD-10-CM | POA: Diagnosis not present

## 2013-11-03 DIAGNOSIS — Z131 Encounter for screening for diabetes mellitus: Secondary | ICD-10-CM | POA: Diagnosis not present

## 2013-11-03 DIAGNOSIS — M25561 Pain in right knee: Secondary | ICD-10-CM

## 2013-11-03 DIAGNOSIS — Z13220 Encounter for screening for lipoid disorders: Secondary | ICD-10-CM | POA: Diagnosis not present

## 2013-11-03 DIAGNOSIS — M25569 Pain in unspecified knee: Secondary | ICD-10-CM

## 2013-11-03 DIAGNOSIS — F3181 Bipolar II disorder: Secondary | ICD-10-CM | POA: Insufficient documentation

## 2013-11-03 DIAGNOSIS — F3189 Other bipolar disorder: Secondary | ICD-10-CM

## 2013-11-03 DIAGNOSIS — M797 Fibromyalgia: Secondary | ICD-10-CM

## 2013-11-03 DIAGNOSIS — IMO0002 Reserved for concepts with insufficient information to code with codable children: Secondary | ICD-10-CM

## 2013-11-03 DIAGNOSIS — G2579 Other drug induced movement disorders: Secondary | ICD-10-CM | POA: Insufficient documentation

## 2013-11-03 DIAGNOSIS — S83206S Unspecified tear of unspecified meniscus, current injury, right knee, sequela: Secondary | ICD-10-CM

## 2013-11-03 MED ORDER — ORPHENADRINE CITRATE ER 100 MG PO TB12: 100.0000 mg | ORAL_TABLET | Freq: Two times a day (BID) | ORAL | Status: DC

## 2013-11-03 MED ORDER — OXYCODONE-ACETAMINOPHEN 5-325 MG PO TABS: 1.0000 | ORAL_TABLET | Freq: Three times a day (TID) | ORAL | Status: DC | PRN

## 2013-11-03 MED ORDER — MELOXICAM 15 MG PO TABS: 15.0000 mg | ORAL_TABLET | Freq: Every day | ORAL | Status: DC

## 2013-11-03 NOTE — Patient Instructions (Addendum)
Try addition of muscle relaxation.  Mobic daily.   Oxycodone as needed.    Fibromyalgia Fibromyalgia is a disorder that is often misunderstood. It is associated with muscular pains and tenderness that comes and goes. It is often associated with fatigue and sleep disturbances. Though it tends to be long-lasting, fibromyalgia is not life-threatening. CAUSES  The exact cause of fibromyalgia is unknown. People with certain gene types are predisposed to developing fibromyalgia and other conditions. Certain factors can play a role as triggers, such as:  Spine disorders.  Arthritis.  Severe injury (trauma) and other physical stressors.  Emotional stressors. SYMPTOMS   The main symptom is pain and stiffness in the muscles and joints, which can vary over time.  Sleep and fatigue problems. Other related symptoms may include:  Bowel and bladder problems.  Headaches.  Visual problems.  Problems with odors and noises.  Depression or mood changes.  Painful periods (dysmenorrhea).  Dryness of the skin or eyes. DIAGNOSIS  There are no specific tests for diagnosing fibromyalgia. Patients can be diagnosed accurately from the specific symptoms they have. The diagnosis is made by determining that nothing else is causing the problems. TREATMENT  There is no cure. Management includes medicines and an active, healthy lifestyle. The goal is to enhance physical fitness, decrease pain, and improve sleep. HOME CARE INSTRUCTIONS   Only take over-the-counter or prescription medicines as directed by your caregiver. Sleeping pills, tranquilizers, and pain medicines may make your problems worse.  Low-impact aerobic exercise is very important and advised for treatment. At first, it may seem to make pain worse. Gradually increasing your tolerance will overcome this feeling.  Learning relaxation techniques and how to control stress will help you. Biofeedback, visual imagery, hypnosis, muscle relaxation,  yoga, and meditation are all options.  Anti-inflammatory medicines and physical therapy may provide short-term help.  Acupuncture or massage treatments may help.  Take muscle relaxant medicines as suggested by your caregiver.  Avoid stressful situations.  Plan a healthy lifestyle. This includes your diet, sleep, rest, exercise, and friends.  Find and practice a hobby you enjoy.  Join a fibromyalgia support group for interaction, ideas, and sharing advice. This may be helpful. SEEK MEDICAL CARE IF:  You are not having good results or improvement from your treatment. FOR MORE INFORMATION  National Fibromyalgia Association: www.fmaware.org Arthritis Foundation: www.arthritis.org Document Released: 02/09/2005 Document Revised: 05/04/2011 Document Reviewed: 05/22/2009 [facility] Patient Information 2015 [place], [place]. This information is not intended to replace advice given to you by your health care provider. Make sure you discuss any questions you have with your health care provider.

## 2013-11-06 DIAGNOSIS — M25561 Pain in right knee: Secondary | ICD-10-CM | POA: Insufficient documentation

## 2013-11-06 DIAGNOSIS — M1711 Unilateral primary osteoarthritis, right knee: Secondary | ICD-10-CM | POA: Insufficient documentation

## 2013-11-06 NOTE — Progress Notes (Signed)
Subjective:    Patient ID: Tracey Bender, female    DOB: May 23, 1970, 43 y.o.   MRN: [id number]  HPI Pt is a 43 yo female who presents to the clinic to establish care.   .. Active Ambulatory Problems    Diagnosis Date Noted  . THRUSH 09/09/2010  . Fibromyalgia 11/03/2013  . Bipolar II disorder 11/03/2013  . Serotonin syndrome 11/03/2013   Resolved Ambulatory Problems    Diagnosis Date Noted  . No Resolved Ambulatory Problems   Past Medical History  Diagnosis Date  . Bipolar 2 disorder    .Marland Kitchen Family History  Problem Relation Age of Onset  . Hypertension Mother   . Cancer Father     prostate CA   .Marland Kitchen History   Social History  . Marital Status: Divorced    Spouse Name: N/A    Number of Children: N/A  . Years of Education: N/A   Occupational History  . Not on file.   Social History Main Topics  . Smoking status: Current Every Day Smoker  . Smokeless tobacco: Not on file  . Alcohol Use: Yes  . Drug Use: No  . Sexual Activity: Yes   Other Topics Concern  . Not on file   Social History Narrative  . No narrative on file   Patient does carry a diagnosis of bipolar 2 disorder. She does see a psychiatrist for this. She is currently controlled.  Patient also carries a diagnosis of fibromyalgia. She is currently not controlled. She wakes up in pain every day. She's currently on Cymbalta and Neurontin. Neurontin helps the best with pain control. She works a full-time job does not exercise regularly. She feels like all of her muscles are tight from her neck to her lower back. She's tried regular massage therapy that seems to make it worse and make her her up for days after. She does have a small quantity of oxycodone that she's had for over 2 years that she takes half tab as needed for acute pain. She is almost out of that and needs a refill.  She does complain of some right knee pain after surgery for right meniscus repair. She currently does nothing for daily  treatment.   Review of Systems  All other systems reviewed and are negative.      Objective:   Physical Exam  Constitutional: She is oriented to person, place, and time. She appears well-developed and well-nourished.  HENT:  Head: Normocephalic and atraumatic.  Cardiovascular: Normal rate, regular rhythm and normal heart sounds.   Pulmonary/Chest: Effort normal and breath sounds normal. She has no wheezes.  Neurological: She is alert and oriented to person, place, and time.  Skin: Skin is dry.  Psychiatric: She has a normal mood and affect. Her behavior is normal.          Assessment & Plan:  Fibromyaglia- currently taking cymbalta and neurontin. Added mobic daily and norflex up to twice a day. I gave pt a very small quantity of oxycodone to use as needed for acute pain. Discussed I do not prescribe chronic narcotics. Could consider pain clinic referral if needed more regular pain medication. Discussed other symptomatic treatment for fibromyalgia.    Bipolar II- controlled currently on cymbalta and temazepam. Managed by psychiatrist.   Right knee pain-will refer to Dr. Megan Salon for evaluation and treatment.  History of serotonin syndrome- will be very cautious to add another serotonin receptor agonist due to this history.   Pt declined flu shot today.  Screening labs given to have drawn.

## 2013-11-08 ENCOUNTER — Ambulatory Visit (INDEPENDENT_AMBULATORY_CARE_PROVIDER_SITE_OTHER): Payer: BC Managed Care – HMO | Admitting: Obstetrics & Gynecology

## 2013-11-08 ENCOUNTER — Encounter: Payer: Self-pay | Admitting: Obstetrics & Gynecology

## 2013-11-08 VITALS — BP 114/74 | HR 78 | Resp 16 | Ht 64.75 in | Wt 147.0 lb

## 2013-11-08 DIAGNOSIS — Z1151 Encounter for screening for human papillomavirus (HPV): Secondary | ICD-10-CM

## 2013-11-08 DIAGNOSIS — Z87440 Personal history of urinary (tract) infections: Secondary | ICD-10-CM

## 2013-11-08 DIAGNOSIS — Z01419 Encounter for gynecological examination (general) (routine) without abnormal findings: Secondary | ICD-10-CM | POA: Diagnosis not present

## 2013-11-08 DIAGNOSIS — Z124 Encounter for screening for malignant neoplasm of cervix: Secondary | ICD-10-CM

## 2013-11-08 LAB — POCT URINALYSIS DIPSTICK
Bilirubin, UA: NEGATIVE
Blood, UA: NEGATIVE
Glucose, UA: NEGATIVE
Ketones, UA: NEGATIVE
LEUKOCYTES UA: NEGATIVE
Nitrite, UA: NEGATIVE
PROTEIN UA: NEGATIVE
Spec Grav, UA: 1.01
Urobilinogen, UA: 0.2
pH, UA: 6.5

## 2013-11-08 NOTE — Progress Notes (Signed)
    GYNECOLOGY CLINIC ANNUAL PREVENTATIVE CARE ENCOUNTER NOTE  Subjective:     Tracey Bender is a 43 y.o. [phone] female here for a routine annual gynecologic exam.  Current complaints: none. Gets occasional UTIs after intercourse, wants to know if she has one now.     Gynecologic History Patient's last menstrual period was 11/02/2013. Contraception: vasectomy Last Pap: 2014. Results were: normal Last mammogram: normal. Results were: normal  Obstetric History OB History  No data available    The following portions of the patient's history were reviewed and updated as appropriate: allergies, current medications, past family history, past medical history, past social history, past surgical history and problem list.  Review of Systems Pertinent items are noted in HPI.    Objective:   BP 114/74  Pulse 78  Resp 16  Ht 5' 4.75" (1.645 m)  Wt 147 lb (66.679 kg)  BMI 24.64 kg/m2  LMP 11/02/2013 GENERAL: Well-developed, well-nourished female in no acute distress.  HEENT: Normocephalic, atraumatic. Sclerae anicteric.  NECK: Supple. Normal thyroid.  LUNGS: Clear to auscultation bilaterally.  HEART: Regular rate and rhythm. BREASTS: Symmetric in size. No masses, skin changes, nipple drainage, or lymphadenopathy. ABDOMEN: Soft, nontender, nondistended. No organomegaly. PELVIC: Normal external female genitalia. Vagina is pink and rugated.  Normal discharge. Normal cervix contour. Pap smear obtained. Uterus is normal in size. No adnexal mass or tenderness.  EXTREMITIES: No cyanosis, clubbing, or edema, 2+ distal pulses.  UA negative  Assessment:   Annual gynecologic examination   Plan:   Pap done, will follow up results and manage accordingly. Mammogram scheduled UA negative.  Patient was advised about measures to help reduce postcoital UTIs. Routine preventative health maintenance measures emphasized   Jaynie Collins, MD, FACOG Attending Obstetrician & Gynecologist Center  for [facility], [place] [facility]

## 2013-11-08 NOTE — Patient Instructions (Signed)
Preventive Care for Adults A healthy lifestyle and preventive care can promote health and wellness. Preventive health guidelines for women include the following key practices.  A routine yearly physical is a good way to check with your health care provider about your health and preventive screening. It is a chance to share any concerns and updates on your health and to receive a thorough exam.  Visit your dentist for a routine exam and preventive care every 6 months. Brush your teeth twice a day and floss once a day. Good oral hygiene prevents tooth decay and gum disease.  The frequency of eye exams is based on your age, health, family medical history, use of contact lenses, and other factors. Follow your health care provider's recommendations for frequency of eye exams.  Eat a healthy diet. Foods like vegetables, fruits, whole grains, low-fat dairy products, and lean protein foods contain the nutrients you need without too many calories. Decrease your intake of foods high in solid fats, added sugars, and salt. Eat the right amount of calories for you.Get information about a proper diet from your health care provider, if necessary.  Regular physical exercise is one of the most important things you can do for your health. Most adults should get at least 150 minutes of moderate-intensity exercise (any activity that increases your heart rate and causes you to sweat) each week. In addition, most adults need muscle-strengthening exercises on 2 or more days a week.  Maintain a healthy weight. The body mass index (BMI) is a screening tool to identify possible weight problems. It provides an estimate of body fat based on height and weight. Your health care provider can find your BMI and can help you achieve or maintain a healthy weight.For adults 20 years and older:  A BMI below 18.5 is considered underweight.  A BMI of 18.5 to 24.9 is normal.  A BMI of 25 to 29.9 is considered overweight.  A BMI of  30 and above is considered obese.  Maintain normal blood lipids and cholesterol levels by exercising and minimizing your intake of saturated fat. Eat a balanced diet with plenty of fruit and vegetables. Blood tests for lipids and cholesterol should begin at age 76 and be repeated every 5 years. If your lipid or cholesterol levels are high, you are over 50, or you are at high risk for heart disease, you may need your cholesterol levels checked more frequently.Ongoing high lipid and cholesterol levels should be treated with medicines if diet and exercise are not working.  If you smoke, find out from your health care provider how to quit. If you do not use tobacco, do not start.  Lung cancer screening is recommended for adults aged 22-80 years who are at high risk for developing lung cancer because of a history of smoking. A yearly low-dose CT scan of the lungs is recommended for people who have at least a 30-pack-year history of smoking and are a current smoker or have quit within the past 15 years. A pack year of smoking is smoking an average of 1 pack of cigarettes a day for 1 year (for example: 1 pack a day for 30 years or 2 packs a day for 15 years). Yearly screening should continue until the smoker has stopped smoking for at least 15 years. Yearly screening should be stopped for people who develop a health problem that would prevent them from having lung cancer treatment.  If you are pregnant, do not drink alcohol. If you are breastfeeding,  be very cautious about drinking alcohol. If you are not pregnant and choose to drink alcohol, do not have more than 1 drink per day. One drink is considered to be 12 ounces (355 mL) of beer, 5 ounces (148 mL) of wine, or 1.5 ounces (44 mL) of liquor.  Avoid use of street drugs. Do not share needles with anyone. Ask for help if you need support or instructions about stopping the use of drugs.  High blood pressure causes heart disease and increases the risk of  stroke. Your blood pressure should be checked at least every 1 to 2 years. Ongoing high blood pressure should be treated with medicines if weight loss and exercise do not work.  If you are 3-86 years old, ask your health care provider if you should take aspirin to prevent strokes.  Diabetes screening involves taking a blood sample to check your fasting blood sugar level. This should be done once every 3 years, after age 67, if you are within normal weight and without risk factors for diabetes. Testing should be considered at a younger age or be carried out more frequently if you are overweight and have at least 1 risk factor for diabetes.  Breast cancer screening is essential preventive care for women. You should practice "breast self-awareness." This means understanding the normal appearance and feel of your breasts and may include breast self-examination. Any changes detected, no matter how small, should be reported to a health care provider. Women in their 8s and 30s should have a clinical breast exam (CBE) by a health care provider as part of a regular health exam every 1 to 3 years. After age 70, women should have a CBE every year. Starting at age 25, women should consider having a mammogram (breast X-ray test) every year. Women who have a family history of breast cancer should talk to their health care provider about genetic screening. Women at a high risk of breast cancer should talk to their health care providers about having an MRI and a mammogram every year.  Breast cancer gene (BRCA)-related cancer risk assessment is recommended for women who have family members with BRCA-related cancers. BRCA-related cancers include breast, ovarian, tubal, and peritoneal cancers. Having family members with these cancers may be associated with an increased risk for harmful changes (mutations) in the breast cancer genes BRCA1 and BRCA2. Results of the assessment will determine the need for genetic counseling and  BRCA1 and BRCA2 testing.  Routine pelvic exams to screen for cancer are no longer recommended for nonpregnant women who are considered low risk for cancer of the pelvic organs (ovaries, uterus, and vagina) and who do not have symptoms. Ask your health care provider if a screening pelvic exam is right for you.  If you have had past treatment for cervical cancer or a condition that could lead to cancer, you need Pap tests and screening for cancer for at least 20 years after your treatment. If Pap tests have been discontinued, your risk factors (such as having a new sexual partner) need to be reassessed to determine if screening should be resumed. Some women have medical problems that increase the chance of getting cervical cancer. In these cases, your health care provider may recommend more frequent screening and Pap tests.  The HPV test is an additional test that may be used for cervical cancer screening. The HPV test looks for the virus that can cause the cell changes on the cervix. The cells collected during the Pap test can be  tested for HPV. The HPV test could be used to screen women aged 30 years and older, and should be used in women of any age who have unclear Pap test results. After the age of 30, women should have HPV testing at the same frequency as a Pap test.  Colorectal cancer can be detected and often prevented. Most routine colorectal cancer screening begins at the age of 50 years and continues through age 75 years. However, your health care provider may recommend screening at an earlier age if you have risk factors for colon cancer. On a yearly basis, your health care provider may provide home test kits to check for hidden blood in the stool. Use of a small camera at the end of a tube, to directly examine the colon (sigmoidoscopy or colonoscopy), can detect the earliest forms of colorectal cancer. Talk to your health care provider about this at age 50, when routine screening begins. Direct  exam of the colon should be repeated every 5-10 years through age 75 years, unless early forms of pre-cancerous polyps or small growths are found.  People who are at an increased risk for hepatitis B should be screened for this virus. You are considered at high risk for hepatitis B if:  You were born in a country where hepatitis B occurs often. Talk with your health care provider about which countries are considered high risk.  Your parents were born in a high-risk country and you have not received a shot to protect against hepatitis B (hepatitis B vaccine).  You have HIV or AIDS.  You use needles to inject street drugs.  You live with, or have sex with, someone who has hepatitis B.  You get hemodialysis treatment.  You take certain medicines for conditions like cancer, organ transplantation, and autoimmune conditions.  Hepatitis C blood testing is recommended for all people born from 1945 through 1965 and any individual with known risks for hepatitis C.  Practice safe sex. Use condoms and avoid high-risk sexual practices to reduce the spread of sexually transmitted infections (STIs). STIs include gonorrhea, chlamydia, syphilis, trichomonas, herpes, HPV, and human immunodeficiency virus (HIV). Herpes, HIV, and HPV are viral illnesses that have no cure. They can result in disability, cancer, and death.  You should be screened for sexually transmitted illnesses (STIs) including gonorrhea and chlamydia if:  You are sexually active and are younger than 24 years.  You are older than 24 years and your health care provider tells you that you are at risk for this type of infection.  Your sexual activity has changed since you were last screened and you are at an increased risk for chlamydia or gonorrhea. Ask your health care provider if you are at risk.  If you are at risk of being infected with HIV, it is recommended that you take a prescription medicine daily to prevent HIV infection. This is  called preexposure prophylaxis (PrEP). You are considered at risk if:  You are a heterosexual woman, are sexually active, and are at increased risk for HIV infection.  You take drugs by injection.  You are sexually active with a partner who has HIV.  Talk with your health care provider about whether you are at high risk of being infected with HIV. If you choose to begin PrEP, you should first be tested for HIV. You should then be tested every 3 months for as long as you are taking PrEP.  Osteoporosis is a disease in which the bones lose minerals and strength   with aging. This can result in serious bone fractures or breaks. The risk of osteoporosis can be identified using a bone density scan. Women ages 65 years and over and women at risk for fractures or osteoporosis should discuss screening with their health care providers. Ask your health care provider whether you should take a calcium supplement or vitamin D to reduce the rate of osteoporosis.  Menopause can be associated with physical symptoms and risks. Hormone replacement therapy is available to decrease symptoms and risks. You should talk to your health care provider about whether hormone replacement therapy is right for you.  Use sunscreen. Apply sunscreen liberally and repeatedly throughout the day. You should seek shade when your shadow is shorter than you. Protect yourself by wearing long sleeves, pants, a wide-brimmed hat, and sunglasses year round, whenever you are outdoors.  Once a month, do a whole body skin exam, using a mirror to look at the skin on your back. Tell your health care provider of new moles, moles that have irregular borders, moles that are larger than a pencil eraser, or moles that have changed in shape or color.  Stay current with required vaccines (immunizations).  Influenza vaccine. All adults should be immunized every year.  Tetanus, diphtheria, and acellular pertussis (Td, Tdap) vaccine. Pregnant women should  receive 1 dose of Tdap vaccine during each pregnancy. The dose should be obtained regardless of the length of time since the last dose. Immunization is preferred during the 27th-36th week of gestation. An adult who has not previously received Tdap or who does not know her vaccine status should receive 1 dose of Tdap. This initial dose should be followed by tetanus and diphtheria toxoids (Td) booster doses every 10 years. Adults with an unknown or incomplete history of completing a 3-dose immunization series with Td-containing vaccines should begin or complete a primary immunization series including a Tdap dose. Adults should receive a Td booster every 10 years.  Varicella vaccine. An adult without evidence of immunity to varicella should receive 2 doses or a second dose if she has previously received 1 dose. Pregnant females who do not have evidence of immunity should receive the first dose after pregnancy. This first dose should be obtained before leaving the health care facility. The second dose should be obtained 4-8 weeks after the first dose.  Human papillomavirus (HPV) vaccine. Females aged 13-26 years who have not received the vaccine previously should obtain the 3-dose series. The vaccine is not recommended for use in pregnant females. However, pregnancy testing is not needed before receiving a dose. If a female is found to be pregnant after receiving a dose, no treatment is needed. In that case, the remaining doses should be delayed until after the pregnancy. Immunization is recommended for any person with an immunocompromised condition through the age of 26 years if she did not get any or all doses earlier. During the 3-dose series, the second dose should be obtained 4-8 weeks after the first dose. The third dose should be obtained 24 weeks after the first dose and 16 weeks after the second dose.  Zoster vaccine. One dose is recommended for adults aged 60 years or older unless certain conditions are  present.  Measles, mumps, and rubella (MMR) vaccine. Adults born before 1957 generally are considered immune to measles and mumps. Adults born in 1957 or later should have 1 or more doses of MMR vaccine unless there is a contraindication to the vaccine or there is laboratory evidence of immunity to   each of the three diseases. A routine second dose of MMR vaccine should be obtained at least 28 days after the first dose for students attending postsecondary schools, health care workers, or international travelers. People who received inactivated measles vaccine or an unknown type of measles vaccine during 1963-1967 should receive 2 doses of MMR vaccine. People who received inactivated mumps vaccine or an unknown type of mumps vaccine before 1979 and are at high risk for mumps infection should consider immunization with 2 doses of MMR vaccine. For females of childbearing age, rubella immunity should be determined. If there is no evidence of immunity, females who are not pregnant should be vaccinated. If there is no evidence of immunity, females who are pregnant should delay immunization until after pregnancy. Unvaccinated health care workers born before 1957 who lack laboratory evidence of measles, mumps, or rubella immunity or laboratory confirmation of disease should consider measles and mumps immunization with 2 doses of MMR vaccine or rubella immunization with 1 dose of MMR vaccine.  Pneumococcal 13-valent conjugate (PCV13) vaccine. When indicated, a person who is uncertain of her immunization history and has no record of immunization should receive the PCV13 vaccine. An adult aged 19 years or older who has certain medical conditions and has not been previously immunized should receive 1 dose of PCV13 vaccine. This PCV13 should be followed with a dose of pneumococcal polysaccharide (PPSV23) vaccine. The PPSV23 vaccine dose should be obtained at least 8 weeks after the dose of PCV13 vaccine. An adult aged 19  years or older who has certain medical conditions and previously received 1 or more doses of PPSV23 vaccine should receive 1 dose of PCV13. The PCV13 vaccine dose should be obtained 1 or more years after the last PPSV23 vaccine dose.  Pneumococcal polysaccharide (PPSV23) vaccine. When PCV13 is also indicated, PCV13 should be obtained first. All adults aged 65 years and older should be immunized. An adult younger than age 65 years who has certain medical conditions should be immunized. Any person who resides in a nursing home or long-term care facility should be immunized. An adult smoker should be immunized. People with an immunocompromised condition and certain other conditions should receive both PCV13 and PPSV23 vaccines. People with human immunodeficiency virus (HIV) infection should be immunized as soon as possible after diagnosis. Immunization during chemotherapy or radiation therapy should be avoided. Routine use of PPSV23 vaccine is not recommended for American Indians, [place] Natives, or people younger than 65 years unless there are medical conditions that require PPSV23 vaccine. When indicated, people who have unknown immunization and have no record of immunization should receive PPSV23 vaccine. One-time revaccination 5 years after the first dose of PPSV23 is recommended for people aged 19-64 years who have chronic kidney failure, nephrotic syndrome, asplenia, or immunocompromised conditions. People who received 1-2 doses of PPSV23 before age 65 years should receive another dose of PPSV23 vaccine at age 65 years or later if at least 5 years have passed since the previous dose. Doses of PPSV23 are not needed for people immunized with PPSV23 at or after age 65 years.  Meningococcal vaccine. Adults with asplenia or persistent complement component deficiencies should receive 2 doses of quadrivalent meningococcal conjugate (MenACWY-D) vaccine. The doses should be obtained at least 2 months apart.  Microbiologists working with certain meningococcal bacteria, military recruits, people at risk during an outbreak, and people who travel to or live in countries with a high rate of meningitis should be immunized. A first-year college student up through age   21 years who is living in a residence hall should receive a dose if she did not receive a dose on or after her 16th birthday. Adults who have certain high-risk conditions should receive one or more doses of vaccine.  Hepatitis A vaccine. Adults who wish to be protected from this disease, have certain high-risk conditions, work with hepatitis A-infected animals, work in hepatitis A research labs, or travel to or work in countries with a high rate of hepatitis A should be immunized. Adults who were previously unvaccinated and who anticipate close contact with an international adoptee during the first 60 days after arrival in the [place] from a country with a high rate of hepatitis A should be immunized.  Hepatitis B vaccine. Adults who wish to be protected from this disease, have certain high-risk conditions, may be exposed to blood or other infectious body fluids, are household contacts or sex partners of hepatitis B positive people, are clients or workers in certain care facilities, or travel to or work in countries with a high rate of hepatitis B should be immunized.  Haemophilus influenzae type b (Hib) vaccine. A previously unvaccinated person with asplenia or sickle cell disease or having a scheduled splenectomy should receive 1 dose of Hib vaccine. Regardless of previous immunization, a recipient of a hematopoietic stem cell transplant should receive a 3-dose series 6-12 months after her successful transplant. Hib vaccine is not recommended for adults with HIV infection. Preventive Services / Frequency Ages 64 to 68 years  Blood pressure check.** / Every 1 to 2 years.  Lipid and cholesterol check.** / Every 5 years beginning at age  22.  Clinical breast exam.** / Every 3 years for women in their 88s and 53s.  BRCA-related cancer risk assessment.** / For women who have family members with a BRCA-related cancer (breast, ovarian, tubal, or peritoneal cancers).  Pap test.** / Every 2 years from ages 90 through 51. Every 3 years starting at age 21 through age 56 or 3 with a history of 3 consecutive normal Pap tests.  HPV screening.** / Every 3 years from ages 24 through ages 1 to 46 with a history of 3 consecutive normal Pap tests.  Hepatitis C blood test.** / For any individual with known risks for hepatitis C.  Skin self-exam. / Monthly.  Influenza vaccine. / Every year.  Tetanus, diphtheria, and acellular pertussis (Tdap, Td) vaccine.** / Consult your health care provider. Pregnant women should receive 1 dose of Tdap vaccine during each pregnancy. 1 dose of Td every 10 years.  Varicella vaccine.** / Consult your health care provider. Pregnant females who do not have evidence of immunity should receive the first dose after pregnancy.  HPV vaccine. / 3 doses over 6 months, if 72 and younger. The vaccine is not recommended for use in pregnant females. However, pregnancy testing is not needed before receiving a dose.  Measles, mumps, rubella (MMR) vaccine.** / You need at least 1 dose of MMR if you were born in 1957 or later. You may also need a 2nd dose. For females of childbearing age, rubella immunity should be determined. If there is no evidence of immunity, females who are not pregnant should be vaccinated. If there is no evidence of immunity, females who are pregnant should delay immunization until after pregnancy.  Pneumococcal 13-valent conjugate (PCV13) vaccine.** / Consult your health care provider.  Pneumococcal polysaccharide (PPSV23) vaccine.** / 1 to 2 doses if you smoke cigarettes or if you have certain conditions.  Meningococcal vaccine.** /  1 dose if you are age 19 to 21 years and a first-year college  student living in a residence hall, or have one of several medical conditions, you need to get vaccinated against meningococcal disease. You may also need additional booster doses.  Hepatitis A vaccine.** / Consult your health care provider.  Hepatitis B vaccine.** / Consult your health care provider.  Haemophilus influenzae type b (Hib) vaccine.** / Consult your health care provider. Ages 40 to 64 years  Blood pressure check.** / Every 1 to 2 years.  Lipid and cholesterol check.** / Every 5 years beginning at age 20 years.  Lung cancer screening. / Every year if you are aged 55-80 years and have a 30-pack-year history of smoking and currently smoke or have quit within the past 15 years. Yearly screening is stopped once you have quit smoking for at least 15 years or develop a health problem that would prevent you from having lung cancer treatment.  Clinical breast exam.** / Every year after age 40 years.  BRCA-related cancer risk assessment.** / For women who have family members with a BRCA-related cancer (breast, ovarian, tubal, or peritoneal cancers).  Mammogram.** / Every year beginning at age 40 years and continuing for as long as you are in good health. Consult with your health care provider.  Pap test.** / Every 3 years starting at age 30 years through age 65 or 70 years with a history of 3 consecutive normal Pap tests.  HPV screening.** / Every 3 years from ages 30 years through ages 65 to 70 years with a history of 3 consecutive normal Pap tests.  Fecal occult blood test (FOBT) of stool. / Every year beginning at age 50 years and continuing until age 75 years. You may not need to do this test if you get a colonoscopy every 10 years.  Flexible sigmoidoscopy or colonoscopy.** / Every 5 years for a flexible sigmoidoscopy or every 10 years for a colonoscopy beginning at age 50 years and continuing until age 75 years.  Hepatitis C blood test.** / For all people born from 1945 through  1965 and any individual with known risks for hepatitis C.  Skin self-exam. / Monthly.  Influenza vaccine. / Every year.  Tetanus, diphtheria, and acellular pertussis (Tdap/Td) vaccine.** / Consult your health care provider. Pregnant women should receive 1 dose of Tdap vaccine during each pregnancy. 1 dose of Td every 10 years.  Varicella vaccine.** / Consult your health care provider. Pregnant females who do not have evidence of immunity should receive the first dose after pregnancy.  Zoster vaccine.** / 1 dose for adults aged 60 years or older.  Measles, mumps, rubella (MMR) vaccine.** / You need at least 1 dose of MMR if you were born in 1957 or later. You may also need a 2nd dose. For females of childbearing age, rubella immunity should be determined. If there is no evidence of immunity, females who are not pregnant should be vaccinated. If there is no evidence of immunity, females who are pregnant should delay immunization until after pregnancy.  Pneumococcal 13-valent conjugate (PCV13) vaccine.** / Consult your health care provider.  Pneumococcal polysaccharide (PPSV23) vaccine.** / 1 to 2 doses if you smoke cigarettes or if you have certain conditions.  Meningococcal vaccine.** / Consult your health care provider.  Hepatitis A vaccine.** / Consult your health care provider.  Hepatitis B vaccine.** / Consult your health care provider.  Haemophilus influenzae type b (Hib) vaccine.** / Consult your health care provider. Ages 65   years and over  Blood pressure check.** / Every 1 to 2 years.  Lipid and cholesterol check.** / Every 5 years beginning at age 22 years.  Lung cancer screening. / Every year if you are aged 73-80 years and have a 30-pack-year history of smoking and currently smoke or have quit within the past 15 years. Yearly screening is stopped once you have quit smoking for at least 15 years or develop a health problem that would prevent you from having lung cancer  treatment.  Clinical breast exam.** / Every year after age 4 years.  BRCA-related cancer risk assessment.** / For women who have family members with a BRCA-related cancer (breast, ovarian, tubal, or peritoneal cancers).  Mammogram.** / Every year beginning at age 40 years and continuing for as long as you are in good health. Consult with your health care provider.  Pap test.** / Every 3 years starting at age 9 years through age 34 or 91 years with 3 consecutive normal Pap tests. Testing can be stopped between 65 and 70 years with 3 consecutive normal Pap tests and no abnormal Pap or HPV tests in the past 10 years.  HPV screening.** / Every 3 years from ages 57 years through ages 64 or 45 years with a history of 3 consecutive normal Pap tests. Testing can be stopped between 65 and 70 years with 3 consecutive normal Pap tests and no abnormal Pap or HPV tests in the past 10 years.  Fecal occult blood test (FOBT) of stool. / Every year beginning at age 15 years and continuing until age 17 years. You may not need to do this test if you get a colonoscopy every 10 years.  Flexible sigmoidoscopy or colonoscopy.** / Every 5 years for a flexible sigmoidoscopy or every 10 years for a colonoscopy beginning at age 86 years and continuing until age 71 years.  Hepatitis C blood test.** / For all people born from [birth year] through 1967 and any individual with known risks for hepatitis C.  Osteoporosis screening.** / A one-time screening for women ages 83 years and over and women at risk for fractures or osteoporosis.  Skin self-exam. / Monthly.  Influenza vaccine. / Every year.  Tetanus, diphtheria, and acellular pertussis (Tdap/Td) vaccine.** / 1 dose of Td every 10 years.  Varicella vaccine.** / Consult your health care provider.  Zoster vaccine.** / 1 dose for adults aged 61 years or older.  Pneumococcal 13-valent conjugate (PCV13) vaccine.** / Consult your health care provider.  Pneumococcal  polysaccharide (PPSV23) vaccine.** / 1 dose for all adults aged 28 years and older.  Meningococcal vaccine.** / Consult your health care provider.  Hepatitis A vaccine.** / Consult your health care provider.  Hepatitis B vaccine.** / Consult your health care provider.  Haemophilus influenzae type b (Hib) vaccine.** / Consult your health care provider. ** Family history and personal history of risk and conditions may change your health care provider's recommendations. Document Released: 04/07/2001 Document Revised: 06/26/2013 Document Reviewed: 07/07/2010 [facility] Patient Information 2015 [place], [place]. This information is not intended to replace advice given to you by your health care provider. Make sure you discuss any questions you have with your health care provider.

## 2013-11-13 LAB — CYTOLOGY - PAP

## 2013-11-14 ENCOUNTER — Ambulatory Visit (INDEPENDENT_AMBULATORY_CARE_PROVIDER_SITE_OTHER): Payer: BC Managed Care – HMO | Admitting: Sports Medicine

## 2013-11-14 ENCOUNTER — Encounter: Payer: Self-pay | Admitting: Sports Medicine

## 2013-11-14 VITALS — BP 112/69 | HR 85 | Ht 64.0 in | Wt 147.0 lb

## 2013-11-14 DIAGNOSIS — IMO0002 Reserved for concepts with insufficient information to code with codable children: Secondary | ICD-10-CM | POA: Diagnosis not present

## 2013-11-14 DIAGNOSIS — M5416 Radiculopathy, lumbar region: Secondary | ICD-10-CM | POA: Insufficient documentation

## 2013-11-14 DIAGNOSIS — M1711 Unilateral primary osteoarthritis, right knee: Secondary | ICD-10-CM

## 2013-11-14 DIAGNOSIS — M1710 Unilateral primary osteoarthritis, unspecified knee: Secondary | ICD-10-CM | POA: Diagnosis not present

## 2013-11-14 MED ORDER — PREDNISONE (PAK) 10 MG PO TABS: ORAL_TABLET | ORAL | Status: DC

## 2013-11-14 MED ORDER — NAPROXEN-ESOMEPRAZOLE 500-20 MG PO TBEC: 1.0000 | DELAYED_RELEASE_TABLET | Freq: Two times a day (BID) | ORAL | Status: DC

## 2013-11-14 NOTE — Assessment & Plan Note (Signed)
This most likely represents a right-sided L3 versus L4 radiculitis, I disagree with the diagnosis of complex regional pain syndrome. Home rehabilitation, prednisone taper. Return in one month, MRI for intervention if no better.

## 2013-11-14 NOTE — Assessment & Plan Note (Signed)
Intra-articular injection as above. Rehabilitation. Switching to Vimovo Return in one month.

## 2013-11-14 NOTE — Progress Notes (Signed)
Patient ID: Tracey Bender, female   DOB: September 04, 1970, 43 y.o.   MRN: [id number]   Subjective:    I'm seeing this patient as a consultation for: Tracey Bender  CC: Right Knee Pain  HPI: Tracey Bender is a very pleasant 43 year old female with history of bipolar disorder and fibromylagia who presents with 4.5 years of right knee pain after an injury. States that she did have surgery on the right knee in January of 2013 for a presumed meniscal tear seen on MRI.  Surgical evaluation revealed lack of a meniscal tear and presence of Plica syndrome, osteoarthritis of the knee, and a Baker's cyst. She has had no pain relief following her surgery and has had numbness, pain and tingling of the anterior compartment of the thigh since then. This was previously diagnosed as complex regional pain syndrome. She states that her gait has changed somewhat due to weakness in her right leg. No saddle paresthesias, loss of bowel or bladder control. She was seen by Tracey Bender in our clinic 10 days ago (9/11), who prescribed Mobic for pain relief, but she states that she stopped taking this due to stomach irritation. She does endorse pain in her lower back that is worse with sitting for long periods or forward flexion.  Past medical history, Surgical history, Family history not pertinant except as noted below, Social history, Allergies, and medications have been entered into the medical record, reviewed, and no changes needed.   Review of Systems: No headache, visual changes, body aches, joint swelling, muscle aches, chest pain, shortness of breath or mood changes.   Objective:   General: Well developed, well nourished, and in no acute distress.  Neuro/Psych: Alert and oriented x3, extra-ocular muscles intact, able to move all 4 extremities, sensation grossly intact. Skin: Warm and dry, no rashes noted.  Respiratory: Not using accessory muscles, speaking in full sentences, trachea midline.  Cardiovascular: Pulses palpable,  no extremity edema. Abdomen: Does not appear distended.  Right Knee: Normal to inspection with no erythema or effusion or obvious bony abnormalities. Palpation with no warmth, patellar tenderness, or condyle tenderness. Medial joint line tenderness present. ROM full in flexion and extension and lower leg rotation. Ligaments with solid consistent endpoints including ACL, PCL, LCL, MCL. Non painful patellar compression. Patellar glide with painless crepitus. Patellar and quadriceps tendons unremarkable. Hamstring and quadriceps strength is normal.   Procedure: Real-time Ultrasound Guided Injection of right knee Device: GE Logiq E  Verbal informed consent obtained.  Time-out conducted.  Noted no overlying erythema, induration, or other signs of local infection.  Skin prepped in a sterile fashion.  Local anesthesia: Topical Ethyl chloride.  With sterile technique and under real time ultrasound guidance:  2 cc kenalog 40, 4 cc lidocaine injected easily Completed without difficulty  Pain immediately resolved suggesting accurate placement of the medication.  Advised to call if fevers/chills, erythema, induration, drainage, or persistent bleeding.  Images permanently stored and available for review in the ultrasound unit.  Impression: Technically successful ultrasound guided injection.  Impression and Recommendations:   This case required medical decision making of moderate complexity. Knee Pain: Osteoarthritis is the most likely diagnosis in this patient with degenerative changes previously identified on imaging and pain at the medial joint line. - Intra-articular injection as above.  - Rehabilitation.  - Discontinue Mobic and switch to Vimovo  - Return in one month  Right Lumbar Radiculitis: The reported numbness and pain in the anterior compartment of the thigh, paired with back pain that is  worse with sitting and forward flexion, suggests that right lumbar radiculitis is present. This  most likely represents a right-sided L3 versus L4 radiculitis and is unlikely to be complex regional pain syndrome.  - Home rehabilitation - Prednisone taper - Return in one month, with plan to MRI for intervention if no better.

## 2013-11-15 ENCOUNTER — Telehealth: Payer: Self-pay

## 2013-11-15 NOTE — Telephone Encounter (Signed)
Tracey Bender for [facility] pharmacy called stated that patient was given a rx for Naproxen_esomprazole 500/20mg  and it was really Expensive with the patient insurance. He wants to know if a new Rx can be called in for Naproxen 500 mg and another one for Omeprazole  which will be a lot cheaper. Please advise send to [facility]. Rhonda Cunningham,CMA

## 2013-11-15 NOTE — Telephone Encounter (Signed)
Please call them back and speak to Rocky Link, PharmD, usually there is some deal, see if Duexis would be cheaper, if not then I can certainly try something like diclofenac.  Let me know and I'll send in whatever she wants.

## 2013-11-16 MED ORDER — NAPROXEN 500 MG PO TABS: 500.0000 mg | ORAL_TABLET | Freq: Two times a day (BID) | ORAL | Status: DC

## 2013-11-16 MED ORDER — OMEPRAZOLE 20 MG PO CPDR
20.0000 mg | DELAYED_RELEASE_CAPSULE | Freq: Two times a day (BID) | ORAL | Status: DC
Start: 2013-11-16 — End: 2013-12-21

## 2013-11-16 NOTE — Telephone Encounter (Signed)
Spoke to [facility] he stated that  even with the insurance patient is still looking at hundreds of dollars, he spoke with the patient and she has agreed to split the medication.  Naproxen 500 mg and Esomprazole 20 mgRhonda Bender,CMA

## 2013-11-16 NOTE — Telephone Encounter (Signed)
Not a problem.

## 2013-11-22 ENCOUNTER — Ambulatory Visit (INDEPENDENT_AMBULATORY_CARE_PROVIDER_SITE_OTHER): Payer: BC Managed Care – HMO

## 2013-11-22 DIAGNOSIS — Z1231 Encounter for screening mammogram for malignant neoplasm of breast: Secondary | ICD-10-CM

## 2013-11-22 DIAGNOSIS — Z01419 Encounter for gynecological examination (general) (routine) without abnormal findings: Secondary | ICD-10-CM

## 2013-12-14 ENCOUNTER — Ambulatory Visit: Payer: BC Managed Care – HMO | Admitting: Sports Medicine

## 2013-12-21 ENCOUNTER — Encounter: Payer: Self-pay | Admitting: Obstetrics & Gynecology

## 2013-12-21 ENCOUNTER — Ambulatory Visit (INDEPENDENT_AMBULATORY_CARE_PROVIDER_SITE_OTHER): Payer: BC Managed Care – HMO | Admitting: Obstetrics & Gynecology

## 2013-12-21 VITALS — BP 116/72 | HR 81 | Resp 16 | Ht 64.75 in | Wt 143.0 lb

## 2013-12-21 DIAGNOSIS — N921 Excessive and frequent menstruation with irregular cycle: Secondary | ICD-10-CM

## 2013-12-21 DIAGNOSIS — Z01812 Encounter for preprocedural laboratory examination: Secondary | ICD-10-CM

## 2013-12-21 LAB — POCT URINE PREGNANCY: PREG TEST UR: NEGATIVE

## 2013-12-21 MED ORDER — MEDROXYPROGESTERONE ACETATE 10 MG PO TABS: 10.0000 mg | ORAL_TABLET | Freq: Every day | ORAL | Status: DC

## 2013-12-21 NOTE — Progress Notes (Signed)
   Subjective:    Patient ID: Tracey Bender, female    DOB: [birth year], 43 y.o.   MRN: [id number]  HPI  Pt is a 43 yr old female with nml menses her whole life until this month.  Pt has bled for 20 days straight.  Pt also had endometrial cells on her pap smear last month.  It is advised for endometrial biopsy today and TVUS.  UPT neg  Review of Systems  Constitutional: Negative.   Respiratory: Negative.   Cardiovascular: Negative.   Genitourinary: Positive for vaginal bleeding and menstrual problem. Negative for vaginal discharge and pelvic pain.  Psychiatric/Behavioral: Negative.        Objective:   Physical Exam  Vitals reviewed. Constitutional: She is oriented to person, place, and time. She appears well-developed and well-nourished. No distress.  HENT:  Head: Normocephalic and atraumatic.  Eyes: Conjunctivae are normal.  Pulmonary/Chest: Effort normal.  Abdominal: Soft. She exhibits no distension and no mass. There is no tenderness. There is no rebound and no guarding.  Genitourinary: Vagina normal and uterus normal.  Musculoskeletal: She exhibits no edema.  Neurological: She is alert and oriented to person, place, and time.  Skin: Skin is warm and dry.          Assessment & Plan:  43 yo female with excessive menstruation and endometrial cells on pap  ENDOMETRIAL BIOPSY     The indications for endometrial biopsy were reviewed.   Risks of the biopsy including cramping, bleeding, infection, uterine perforation, inadequate specimen and need for additional procedures  were discussed. The patient states she understands and agrees to undergo procedure today. Consent was signed. Time out was performed. Urine HCG was negative. A sterile speculum was placed in the patient's vagina and the cervix was prepped with Betadine. A single-toothed tenaculum was placed on the anterior lip of the cervix to stabilize it. The 3 mm pipelle was introduced into the endometrial cavity without  difficulty to a depth of 8 cm, and a moderate amount of tissue was obtained and sent to pathology. The instruments were removed from the patient's vagina. Minimal bleeding from the cervix was noted. The patient tolerated the procedure well. Routine post-procedure instructions were given to the patient. The patient will follow up to review the results and for further management.    Start Provera 10 mg x 10 days

## 2013-12-22 ENCOUNTER — Ambulatory Visit (INDEPENDENT_AMBULATORY_CARE_PROVIDER_SITE_OTHER): Payer: BC Managed Care – HMO

## 2013-12-22 DIAGNOSIS — N921 Excessive and frequent menstruation with irregular cycle: Secondary | ICD-10-CM

## 2013-12-25 ENCOUNTER — Encounter: Payer: Self-pay | Admitting: Obstetrics & Gynecology

## 2013-12-26 ENCOUNTER — Telehealth: Payer: Self-pay | Admitting: *Deleted

## 2013-12-26 NOTE — Telephone Encounter (Signed)
-----   Message from Lesly DukesKelly H Leggett, MD sent at 12/25/2013  7:53 PM EST ----- Pt has nml endometrial biopsy.  Please call and give results.  US shows nml uterus except for questionable adenomyosis.  Pt should take Provera and see if her bleeding improves.

## 2013-12-26 NOTE — Telephone Encounter (Signed)
Pt notified of path results and U/S results.  She states that the bleeding has stopped and will hold on the Provera for now.  She will call the office if bleeding reoccurs and will call in Provera to stop the bleeding.

## 2014-01-02 ENCOUNTER — Ambulatory Visit: Payer: BC Managed Care – HMO | Admitting: Obstetrics & Gynecology

## 2014-08-30 ENCOUNTER — Encounter: Payer: Self-pay | Admitting: *Deleted

## 2014-08-30 ENCOUNTER — Emergency Department
Admission: EM | Admit: 2014-08-30 | Discharge: 2014-08-30 | Disposition: A | Discharge status: Home / Self Care | Payer: Self-pay | Source: Home / Self Care | Attending: Family Medicine | Admitting: Family Medicine

## 2014-08-30 DIAGNOSIS — R21 Rash and other nonspecific skin eruption: Secondary | ICD-10-CM

## 2014-08-30 MED ORDER — PREDNISONE 20 MG PO TABS
ORAL_TABLET | ORAL | Status: DC
Start: 2014-08-30 — End: 2014-09-03

## 2014-08-30 NOTE — ED Notes (Signed)
Pt c/o rash to face x 2 1/2 weeks. C/o itching and burning. This rash has previously occurred. Has seen dermatology in the past. Using coconut oil at home.

## 2014-08-30 NOTE — ED Provider Notes (Signed)
CSN: [id number]     Arrival date & time 08/30/14  1224 History   First MD Initiated Contact with Patient 08/30/14 1257     Chief Complaint  Patient presents with  . Rash   (Consider location/radiation/quality/duration/timing/severity/associated sxs/prior Treatment) HPI  Patient is a 44 year old female with history of fibromyalgia and bipolar 2 disorder presenting to urgent care with complaint of constant persistent rash is itching and burning moderate to severe over the last 2 weeks. States she has tried over-the-counter creams and moisturizers do not provide any help but she does find minimal relief with coconut oil. States she was seen by dermatology several years ago for similar symptoms but never determined exact cause of rash. Denies fevers chills nausea vomiting diarrhea denies new medications soaks questions or other known potential causes of rash. Denies known allergies however with like referral to allergist to be tested.  Past Medical History  Diagnosis Date  . Fibromyalgia   . Bipolar 2 disorder    History reviewed. No pertinent past surgical history. Family History  Problem Relation Age of Onset  . Hypertension Mother   . Cancer Father     prostate CA   History  Substance Use Topics  . Smoking status: Current Every Day Smoker  . Smokeless tobacco: Not on file  . Alcohol Use: Yes   OB History    Gravida Para Term Preterm AB TAB SAB Ectopic Multiple Living   Review of Systems  Constitutional: Negative for fever, chills, diaphoresis, appetite change and fatigue.  Gastrointestinal: Negative for nausea, vomiting, abdominal pain and diarrhea.  Skin: Positive for rash. Negative for color change, pallor and wound.  Psychiatric/Behavioral: The patient is nervous/anxious.   All other systems reviewed and are negative.   Allergies  Lamotrigine; Prednisone; Prochlorperazine edisylate; Sulfonamide derivatives; Trazodone; and Vicodin  Home Medications    Prior to Admission medications   Medication Sig Start Date End Date Taking? Authorizing Provider  DULoxetine (CYMBALTA) 60 MG capsule Take 60 mg by mouth daily.   Yes Historical Provider, MD  gabapentin (NEURONTIN) 300 MG capsule Take 300 mg by mouth 3 (three) times daily.   Yes Historical Provider, MD  Oxcarbazepine (TRILEPTAL) 300 MG tablet Take 300 mg by mouth 2 (two) times daily.   Yes Historical Provider, MD  temazepam (RESTORIL) 15 MG capsule Take 15 mg by mouth at bedtime as needed for sleep.   Yes Historical Provider, MD  medroxyPROGESTERone (PROVERA) 10 MG tablet Take 1 tablet (10 mg total) by mouth daily. Use for ten days 12/21/13   Lesly Dukes, MD  Naproxen-Esomeprazole 500-20 MG TBEC Take 1 tablet by mouth 2 (two) times daily. 11/14/13   Monica Becton, MD  orphenadrine (NORFLEX) 100 MG tablet  11/03/13   Historical Provider, MD  predniSONE (DELTASONE) 20 MG tablet 2 tabs po daily x 3 days 08/30/14   Junius Finner, PA-C   BP 118/78 mmHg  Pulse 70  Temp(Src) 98.5 F (36.9 C) (Oral)  Resp 16  Wt 151 lb (68.493 kg)  SpO2 99% Physical Exam  Constitutional: She appears well-developed and well-nourished. No distress.  HENT:  Head: Normocephalic and atraumatic.  Eyes: Conjunctivae are normal. No scleral icterus.  Neck: Normal range of motion.  Cardiovascular: Normal rate, regular rhythm and normal heart sounds.   Pulmonary/Chest: Effort normal and breath sounds normal. No respiratory distress. She has no wheezes. She has no rales. She exhibits no tenderness.  Abdominal: Soft. Bowel sounds are normal. She exhibits no distension and no mass. There is no tenderness. There is no rebound and no guarding.  Musculoskeletal: Normal range of motion.  Neurological: She is alert.  Skin: Skin is warm and dry. Rash noted. She is not diaphoretic. There is erythema.  Erythematous maculopapular rash on face, more prominent on Right side and along nasolabial folds bilaterally. No  vesicles. No induration or fluctuance. No bleeding or discharge.  Nursing note and vitals reviewed.   ED Course  Procedures (including critical care time) Labs Review Labs Reviewed - No data to display  Imaging Review No results found.   MDM   1. Facial rash    Patient is a 44 year old female presenting to urgent care with complaint of recurrent rash that has been present for over 2 weeks.  No known cause a rash. No evidence of anaphylaxis. No evidence of underlying infection at this time. Patient states she has tried multiple medications and over-the-counter creams without relief. Patient has seen dermatologist several years ago for same and was unable to determine cause of rash or specific treatment. Patient has tried over-the-counter hydrocortisone cream which only worsened symptoms due to the burning sensation. Discussed use of short course of prednisone as patient has allergy tried multiple topical medications without relief. Patient does have prednisone listed has drug intolerance with her bipolar disorder however patient is willing to try three-day course of prednisone to see if any relief is provided. Strongly encouraged follow-up with PCP patient may need referral to rheumatologist and endocrinologist dermatologist or other specialist given recurrent burning facial rash. Return precautions provided. Pt verbalized understanding and agreement with tx plan.   Junius Finnerrin O'Malley, PA-C 08/30/14 1429

## 2014-09-03 ENCOUNTER — Telehealth: Payer: Self-pay | Admitting: *Deleted

## 2014-09-03 MED ORDER — PREDNISONE 20 MG PO TABS: ORAL_TABLET | ORAL | Status: DC

## 2014-12-02 IMAGING — US US PELVIS COMPLETE
1 series · 13 of 25 positions shown · non-contrast
Comparison: No similar prior exam is available at this institution
for comparison or on [HOSPITAL] PACS.

CLINICAL DATA: 43-year-old female with vaginal bleeding since
11/30/2013

EXAM:
TRANSABDOMINAL AND TRANSVAGINAL ULTRASOUND OF PELVIS
TECHNIQUE: Both transabdominal and transvaginal ultrasound examinations of the
pelvis were performed. Transabdominal technique was performed for
global imaging of the pelvis including uterus, ovaries, adnexal
regions, and pelvic cul-de-sac. It was necessary to proceed with
endovaginal exam following the transabdominal exam to visualize the
endometrium and ovaries.

[Series 1: us pelvis complete · 0.20mm/px · 13 of 87 slices shown]
[im 1/87]
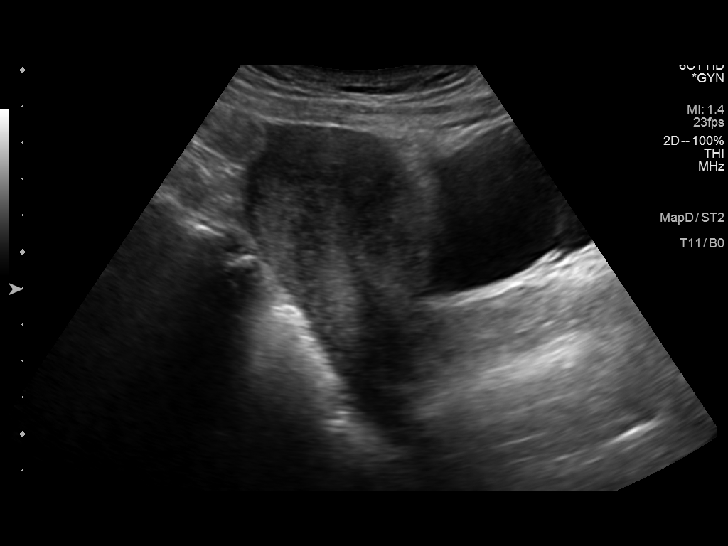
[im 8/87]
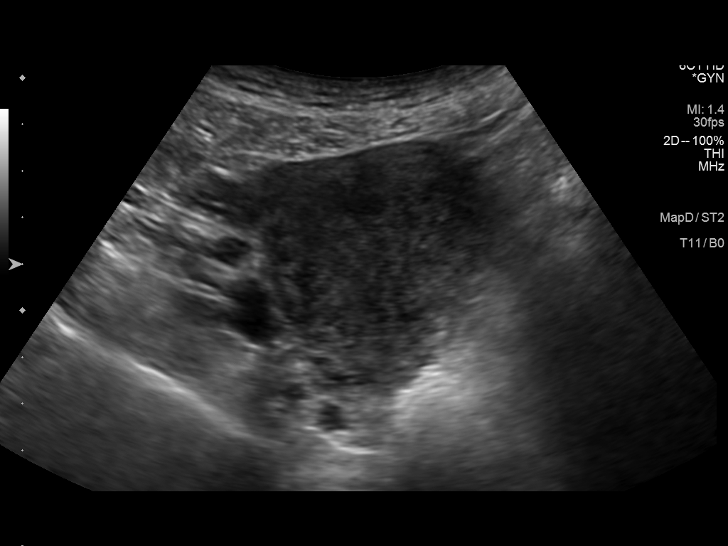
[im 15/87]
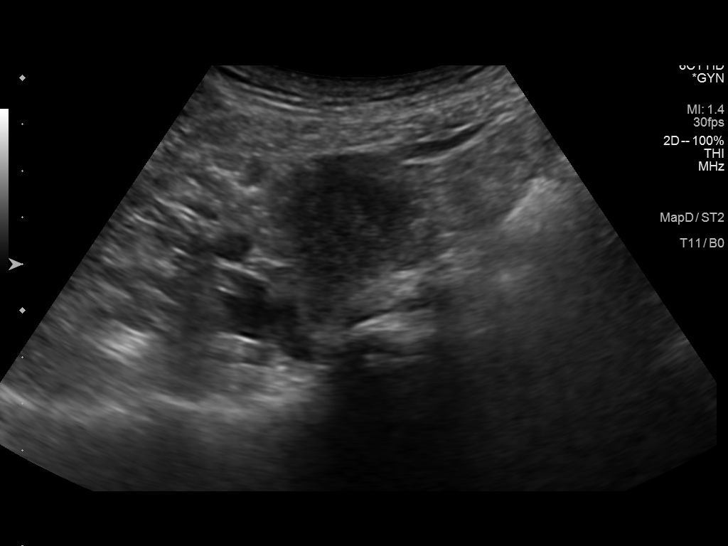
[im 22/87]
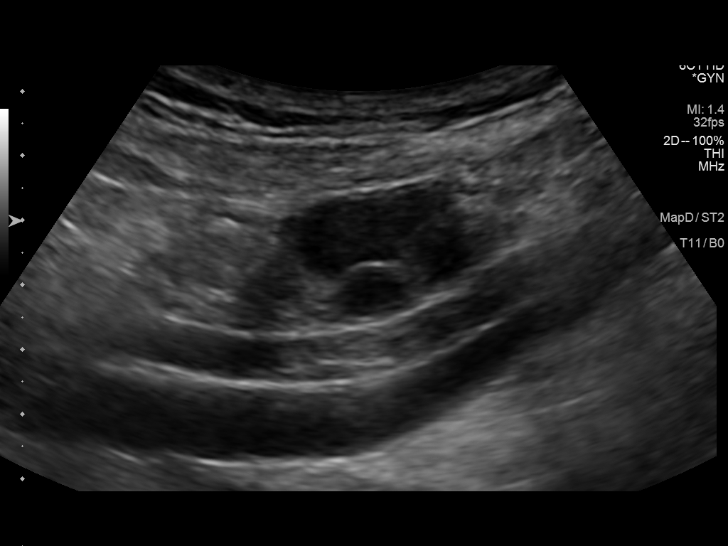
[im 29/87]
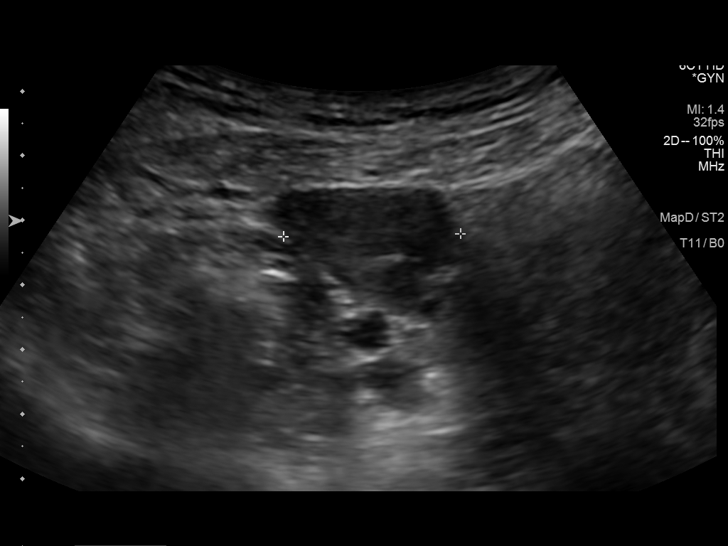
[im 36/87]
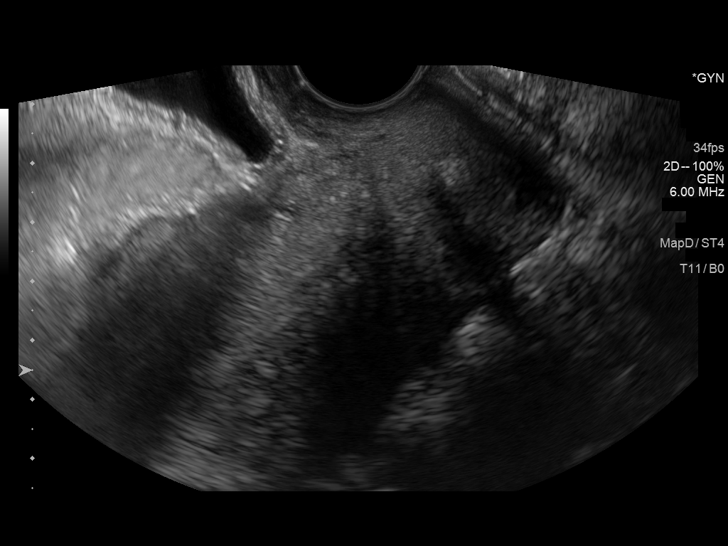
[im 44/87]
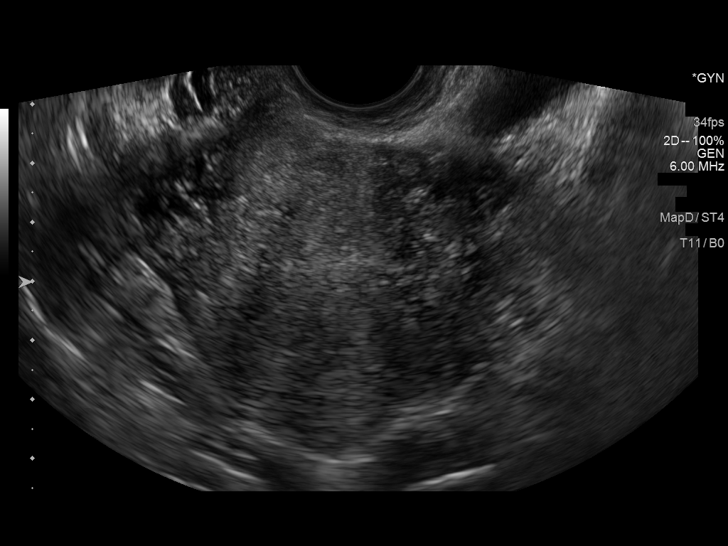
[im 51/87]
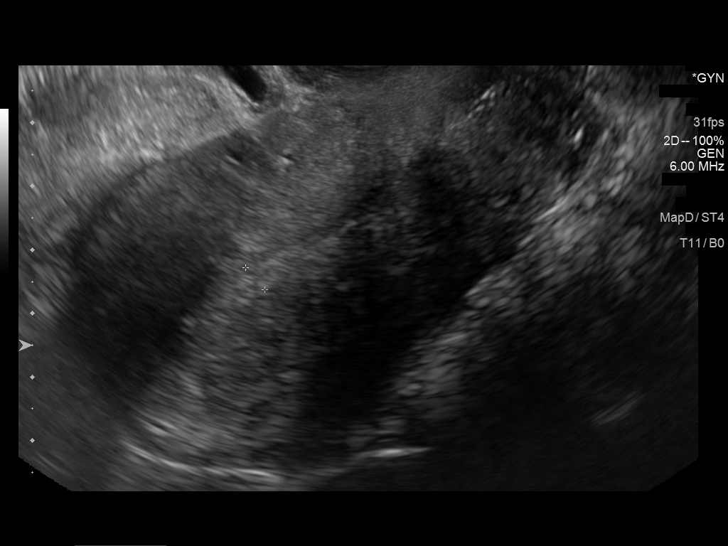
[im 58/87]
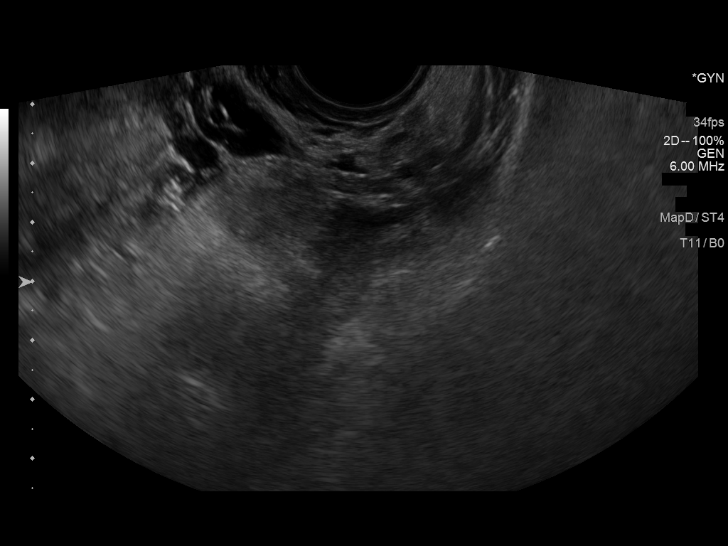
[im 65/87]
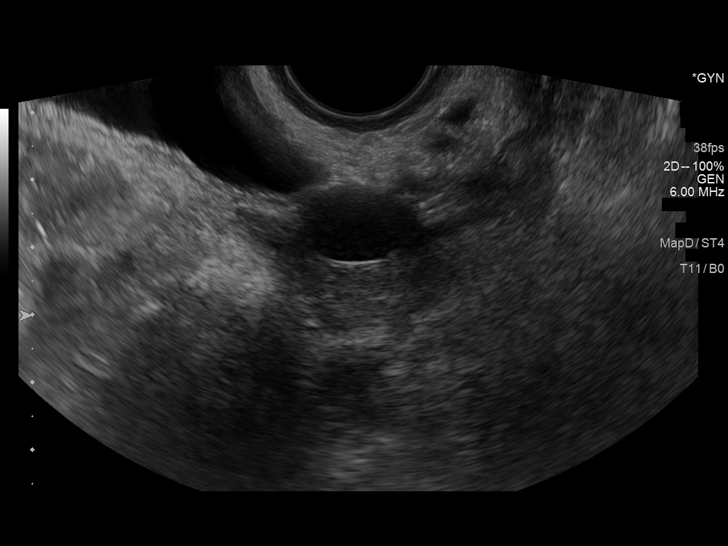
[im 72/87]
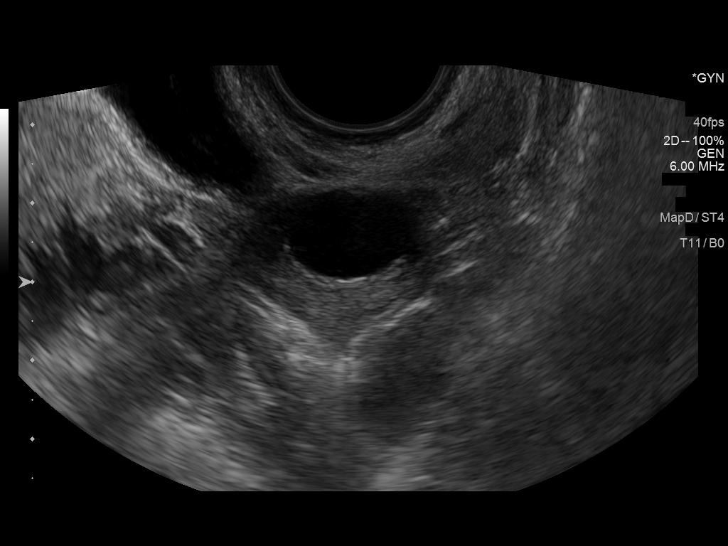
[im 79/87]
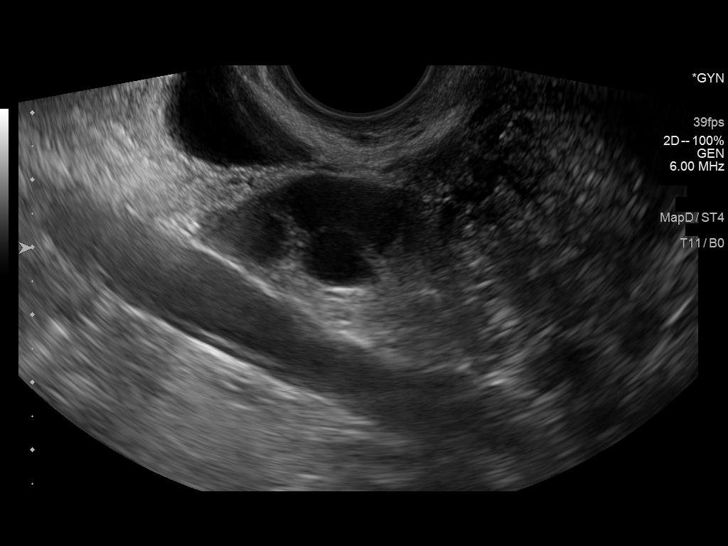
[im 87/87]
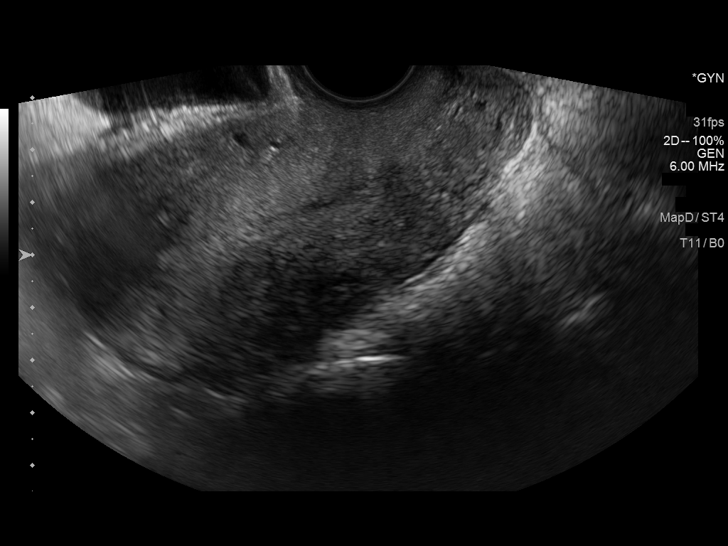

[13 of 25 positions shown; findings below may reference images not displayed]

FINDINGS: Uterus

Measurements: 8.3 x 5.3 x 4.7 cm. Anteverted, anteflexed. Poor
definition of the endometrial/ myometrial interface which may
suggest adenomyosis without focal abnormality. No fibroids or other
mass visualized.

Endometrium

Thickness: 5 mm.  No focal abnormality visualized.

Right ovary

Measurements: 3.3 x 2.6 x 2.6 cm. Normal appearance/no adnexal mass.

Left ovary

Measurements: 2.6 x 2.3 x 1.8 cm. Normal appearance/no adnexal mass.

Other findings

No free fluid.
IMPRESSION: Poor definition of the endometrial/myometrial interface which may be
seen with adenomyosis. This could be confirmed at pelvic MRI with
contrast. Otherwise normal exam without specific abnormality
identified otherwise to explain the history of menorrhagia.

## 2015-01-31 ENCOUNTER — Emergency Department (INDEPENDENT_AMBULATORY_CARE_PROVIDER_SITE_OTHER)
Admission: EM | Admit: 2015-01-31 | Discharge: 2015-01-31 | Disposition: A | Discharge status: Home / Self Care | Payer: BLUE CROSS/BLUE SHIELD | Source: Home / Self Care | Attending: Family Medicine | Admitting: Family Medicine

## 2015-01-31 ENCOUNTER — Encounter: Payer: Self-pay | Admitting: *Deleted

## 2015-01-31 DIAGNOSIS — H9203 Otalgia, bilateral: Secondary | ICD-10-CM

## 2015-01-31 DIAGNOSIS — J0100 Acute maxillary sinusitis, unspecified: Secondary | ICD-10-CM

## 2015-01-31 MED ORDER — FLUTICASONE PROPIONATE 50 MCG/ACT NA SUSP: NASAL | Status: DC

## 2015-01-31 MED ORDER — FLUCONAZOLE 150 MG PO TABS: 150.0000 mg | ORAL_TABLET | Freq: Once | ORAL | Status: DC

## 2015-01-31 MED ORDER — AMOXICILLIN-POT CLAVULANATE 875-125 MG PO TABS: 1.0000 | ORAL_TABLET | Freq: Two times a day (BID) | ORAL | Status: DC

## 2015-01-31 MED ORDER — PREDNISONE 20 MG PO TABS
ORAL_TABLET | ORAL | Status: DC
Start: 2015-01-31 — End: 2015-05-20

## 2015-01-31 MED ORDER — TRIAMCINOLONE ACETONIDE 40 MG/ML IJ SUSP
60.0000 mg | Freq: Once | INTRAMUSCULAR | Status: AC
  Administered 2015-01-31: 60 mg via INTRAMUSCULAR

## 2015-01-31 NOTE — ED Provider Notes (Signed)
CSN: [id number]     Arrival date & time 01/31/15  1128 History   First MD Initiated Contact with Patient 01/31/15 1147     Chief Complaint  Patient presents with  . Otalgia  . Facial Pain   (Consider location/radiation/quality/duration/timing/severity/associated sxs/prior Treatment) HPI  Pt is a 44yo female presenting to [facility] with c/o gradually worsening sinus pressure, nasal congestion, bilateral ear pain, sore throat and hoarse voice for 1 week. 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  Pain is 5/10 aching and sore.  She has tried OTC medications, acetaminophen, ibuprofen, and sudafed.  No relief.  Reports subjective fever. Denies n/v/d.   Past Medical History  Diagnosis Date  . Fibromyalgia   . Bipolar 2 disorder (HCC)    History reviewed. No pertinent past surgical history. Family History  Problem Relation Age of Onset  . Hypertension Mother   . Cancer Father     prostate CA   Social History  Substance Use Topics  . Smoking status: Current Every Day Smoker  . Smokeless tobacco: None  . Alcohol Use: Yes   OB History    Gravida Para Term Preterm AB TAB SAB Ectopic Multiple Living   Review of Systems  Constitutional: Negative for fever and chills.  HENT: Positive for congestion, ear pain ( bilateral), postnasal drip, rhinorrhea, sinus pressure, sore throat and voice change. Negative for trouble swallowing.   Respiratory: Negative for cough and shortness of breath.   Cardiovascular: Negative for chest pain and palpitations.  Gastrointestinal: Negative for nausea, vomiting, abdominal pain and diarrhea.  Musculoskeletal: Negative for myalgias, back pain and arthralgias.  Skin: Negative for rash.  Neurological: Positive for headaches. Negative for dizziness and light-headedness.  All other systems reviewed and are negative.   Allergies  Lamotrigine; Prednisone; Prochlorperazine  edisylate; Sulfonamide derivatives; Trazodone; and Vicodin  Home Medications   Prior to Admission medications   Medication Sig Start Date End Date Taking? Authorizing Provider  gabapentin (NEURONTIN) 300 MG capsule Take 300 mg by mouth 3 (three) times daily. 200 in the am,  pm   Yes Historical Provider, MD  amoxicillin-clavulanate (AUGMENTIN) 875-125 MG tablet Take 1 tablet by mouth 2 (two) times daily. One po bid x 7 days 01/31/15   Junius Finner, PA-C  DULoxetine (CYMBALTA) 60 MG capsule Take 60 mg by mouth daily.    Historical Provider, MD  fluconazole (DIFLUCAN) 150 MG tablet Take 1 tablet (150 mg total) by mouth once. 01/31/15   Junius Finner, PA-C  fluticasone (FLONASE) 50 MCG/ACT nasal spray 1-2 sprays per nostril daily for at least 2 weeks, then seasonally as needed 01/31/15   Junius Finner, PA-C  medroxyPROGESTERone (PROVERA) 10 MG tablet Take 1 tablet (10 mg total) by mouth daily. Use for ten days 12/21/13   Lesly Dukes, MD  Naproxen-Esomeprazole 500-20 MG TBEC Take 1 tablet by mouth 2 (two) times daily. 11/14/13   Monica Becton, MD  orphenadrine (NORFLEX) 100 MG tablet  11/03/13   Historical Provider, MD  Oxcarbazepine (TRILEPTAL) 300 MG tablet Take 300 mg by mouth 2 (two) times daily.    Historical Provider, MD  predniSONE (DELTASONE) 20 MG tablet 2 tabs po daily x 5 days 01/31/15   Junius Finner, PA-C  temazepam (RESTORIL) 15 MG capsule Take 15 mg by mouth at bedtime as needed for sleep.  Historical Provider, MD   Meds Ordered and Administered this Visit   Medications  triamcinolone acetonide (KENALOG-40) injection 60 mg (60 mg Intramuscular Given 01/31/15 1217)    BP 118/77 mmHg  Pulse 73  Temp(Src) 97.7 F (36.5 C) (Oral)  Resp 16  Wt 155 lb (70.308 kg)  SpO2 99% No data found.   Physical Exam  Constitutional: She appears well-developed and well-nourished. No distress.  HENT:  Head: Normocephalic and atraumatic.  Right Ear: Hearing, tympanic membrane,  external ear and ear canal normal.  Left Ear: Hearing, tympanic membrane, external ear and ear canal normal.  Nose: Mucosal edema present. Right sinus exhibits maxillary sinus tenderness. Right sinus exhibits no frontal sinus tenderness. Left sinus exhibits maxillary sinus tenderness and frontal sinus tenderness.  Mouth/Throat: Uvula is midline and mucous membranes are normal. Posterior oropharyngeal erythema present. No oropharyngeal exudate, posterior oropharyngeal edema or tonsillar abscesses.  Eyes: Conjunctivae are normal. No scleral icterus.  Neck: Normal range of motion. Neck supple.  Cardiovascular: Normal rate, regular rhythm and normal heart sounds.   Pulmonary/Chest: Effort normal and breath sounds normal. No stridor. No respiratory distress. She has no wheezes. She has no rales. She exhibits no tenderness.  Abdominal: Soft. Bowel sounds are normal. She exhibits no distension and no mass. There is no tenderness. There is no rebound and no guarding.  Musculoskeletal: Normal range of motion.  Neurological: She is alert.  Skin: Skin is warm and dry. She is not diaphoretic.  Nursing note and vitals reviewed.   ED Course  Procedures (including critical care time)  Labs Review Labs Reviewed - No data to display  Imaging Review No results found.   MDM   1. Acute maxillary sinusitis, recurrence not specified   2. Ear pain, bilateral    Pt c/o worsening sinus congestion with bilateral ear pain and sore throat.  Sx c/w sinusitis.  Due to worsening symptoms of facial pain and ear pain despite OTC treatments, will tx for bacterial cause.   Pt reports hx of "mood swings" when given a steroid in the past but states "that was a long time ago" and she has had joint injections in the past w/o side effects.  Pt willing to try steroids to help with her sinusitis as [place] and hopes to be feeling better by then.  Tx in UC:  kenalog 60mg   Rx: Augmentin and Flonase.  Prednisone 40mg  for 5 days but advised pt she may wait to see if today's treatment helped.  She may also take just a 3 day course if symptoms improving with the antibiotic and Flonase.  Advised pt to use acetaminophen and ibuprofen as needed for fever and pain. Encouraged rest and fluids. F/u with PCP in 7-10 days if not improving, sooner if worsening. Pt verbalized understanding and agreement with tx plan.   Junius Finnerrin O'Malley, PA-C 01/31/15 1238

## 2015-01-31 NOTE — Discharge Instructions (Signed)
You may take 400-600mg  Ibuprofen (Motrin) every 6-8 hours for fever and pain  Alternate with Tylenol  You may take 500mg  Tylenol every 4-6 hours as needed for fever and pain  Follow-up with your primary care provider next week for recheck of symptoms if not improving.  Be sure to drink plenty of fluids and rest, at least 8hrs of sleep a night, preferably more while you are sick. Return urgent care or go to closest ER if you cannot keep down fluids/signs of dehydration, fever not reducing with Tylenol, difficulty breathing/wheezing, stiff neck, worsening condition, or other concerns (see below)  Please take antibiotics as prescribed and be sure to complete entire course even if you start to feel better to ensure infection does not come back.  Today you were given 60mg  of Kenalog, a steroid, to help decrease inflammation in your sinuses to help relieve pressure and pain.  You were also prescribed prednisone pills.  You may start these in 1-2 days if you feel you need additional treatment after the shot.  Please take as prescribed.  If you develop symptoms you may stop taking in 3 days and not complete the entire 5 day course as you will be on the same dose for all days.

## 2015-01-31 NOTE — ED Notes (Signed)
Pt c/o 1 week of sinus pressure, ear pain, HA. 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

## 2015-05-13 ENCOUNTER — Encounter: Payer: Self-pay | Admitting: Physician Assistant

## 2015-05-13 DIAGNOSIS — R490 Dysphonia: Secondary | ICD-10-CM | POA: Insufficient documentation

## 2015-05-15 ENCOUNTER — Encounter: Payer: Self-pay | Admitting: Obstetrics & Gynecology

## 2015-05-15 ENCOUNTER — Ambulatory Visit (INDEPENDENT_AMBULATORY_CARE_PROVIDER_SITE_OTHER): Payer: BLUE CROSS/BLUE SHIELD

## 2015-05-15 ENCOUNTER — Ambulatory Visit (INDEPENDENT_AMBULATORY_CARE_PROVIDER_SITE_OTHER): Payer: BLUE CROSS/BLUE SHIELD | Admitting: Obstetrics & Gynecology

## 2015-05-15 VITALS — BP 123/83 | HR 81 | Ht 65.0 in | Wt 151.0 lb

## 2015-05-15 DIAGNOSIS — Z124 Encounter for screening for malignant neoplasm of cervix: Secondary | ICD-10-CM | POA: Diagnosis not present

## 2015-05-15 DIAGNOSIS — Z1231 Encounter for screening mammogram for malignant neoplasm of breast: Secondary | ICD-10-CM | POA: Diagnosis not present

## 2015-05-15 DIAGNOSIS — Z139 Encounter for screening, unspecified: Secondary | ICD-10-CM

## 2015-05-15 DIAGNOSIS — Z113 Encounter for screening for infections with a predominantly sexual mode of transmission: Secondary | ICD-10-CM

## 2015-05-15 DIAGNOSIS — Z0000 Encounter for general adult medical examination without abnormal findings: Secondary | ICD-10-CM

## 2015-05-15 DIAGNOSIS — Z01419 Encounter for gynecological examination (general) (routine) without abnormal findings: Secondary | ICD-10-CM | POA: Diagnosis not present

## 2015-05-15 DIAGNOSIS — Z1151 Encounter for screening for human papillomavirus (HPV): Secondary | ICD-10-CM | POA: Diagnosis not present

## 2015-05-15 LAB — CBC
HCT: 42.8 % (ref 36.0–46.0)
HEMOGLOBIN: 14.2 g/dL (ref 12.0–15.0)
MCH: 29.3 pg (ref 26.0–34.0)
MCHC: 33.2 g/dL (ref 30.0–36.0)
MCV: 88.2 fL (ref 78.0–100.0)
MPV: 10.1 fL (ref 8.6–12.4)
PLATELETS: 369 10*3/uL (ref 150–400)
RBC: 4.85 MIL/uL (ref 3.87–5.11)
RDW: 13.5 % (ref 11.5–15.5)
WBC: 6.5 10*3/uL (ref 4.0–10.5)

## 2015-05-15 LAB — TSH: TSH: 1.78 m[IU]/L

## 2015-05-15 MED ORDER — TRIAMCINOLONE ACETONIDE 0.5 % EX OINT: 1.0000 "application " | TOPICAL_OINTMENT | Freq: Two times a day (BID) | CUTANEOUS | Status: DC

## 2015-05-15 MED ORDER — HYDROCORTISONE ACE-PRAMOXINE 1-1 % RE FOAM: 1.0000 | Freq: Two times a day (BID) | RECTAL | Status: DC

## 2015-05-15 NOTE — Patient Instructions (Signed)

## 2015-05-16 LAB — CYTOLOGY - PAP

## 2015-05-16 LAB — HIV ANTIBODY (ROUTINE TESTING W REFLEX): HIV: NONREACTIVE

## 2015-05-16 LAB — HEPATITIS C ANTIBODY: HCV Ab: NEGATIVE

## 2015-05-16 LAB — HEPATITIS B SURFACE ANTIGEN: Hepatitis B Surface Ag: NEGATIVE

## 2015-05-16 LAB — RPR

## 2015-05-16 NOTE — Progress Notes (Signed)
  Subjective:     Tracey Bender is a 45 y.o. female here for a routine exam.  Current complaints: Back pain that makes her leg still like Jell-O, right-sided ovarian pain that happened 2 cycles but not consecutively. The pain began 2 days for her menses and was sharp. It went away upon starting of her menses. It happened this month and 2 months ago. Patient sometimes has pain on deep penetration during sexual intercourse.  She also complaining of hemorrhoid itching and stool leakage. Patient is not tried OTC medications without relief. She has an occasional hot flashed Pt also c/o patches of dry skin on her wrists.   Gynecologic History Patient's last menstrual period was 05/04/2015. Contraception: vasectomy Last Pap: 2015. Results were: normal; Patient requests another Pap smear today. She says she's had dysplasia in the past. I do not have record of this. She's not had a LEEP or cryo-but has had a colposcopy. Last mammogram: 2015. Results were: normal  Obstetric History OB History  Gravida Para Term Preterm AB SAB TAB Ectopic Multiple Living  4 3 3  1 1    3     # Outcome Date GA Lbr Len/2nd Weight Sex Delivery Anes PTL Lv  4 SAB           3 Term      Vag-Spont     2 Term      Vag-Spont     1 Term      Vag-Spont          The following portions of the patient's history were reviewed and updated as appropriate: allergies, current medications, past family history, past medical history, past social history, past surgical history and problem list.  Review of Systems Pertinent items noted in HPI and remainder of comprehensive ROS otherwise negative.    Objective:      Filed Vitals:   05/15/15 0932  BP: 123/83  Pulse: 81  Height: 5\' 5"  (1.651 m)  Weight: 151 lb (68.493 kg)   Vitals:  WNL General appearance: alert, cooperative and no distress  HEENT: Normocephalic, without obvious abnormality, atraumatic Eyes: negative Throat: lips, mucosa, and tongue normal; teeth and gums normal   Respiratory: Clear to auscultation bilaterally  CV: Regular rate and rhythm  Breasts:  Normal appearance, no masses or tenderness, no nipple retraction or dimpling  GI: Soft, non-tender; bowel sounds normal; no masses,  no organomegaly  GU: External Genitalia:  Tanner V, no lesion Urethra:  No prolapse   Vagina: Pink, normal rugae, no blood or discharge  Cervix: No CMT, no lesion  Uterus:  Normal size and contour, non tender  Adnexa: Normal, no masses, non tender  Musculoskeletal: No edema, redness or tenderness in the calves or thighs  Skin: Eczena  Lymphatic: Axillary adenopathy: none    Psychiatric: Normal mood and behavior        Assessment:    Healthy female exam. , No pelvic pain on exam Hemorrhoids Eczema    Plan:    Education reviewed: self breast exams. Contraception: vasectomy. Mammogram ordered. Follow up in: 1 year. Procotfoam for hemorrhoids  triamcinolone for eczema (not on face) STD check per pt request CBC, CMP, Cholesterol for screening.

## 2015-05-17 ENCOUNTER — Other Ambulatory Visit (INDEPENDENT_AMBULATORY_CARE_PROVIDER_SITE_OTHER): Payer: BLUE CROSS/BLUE SHIELD | Admitting: *Deleted

## 2015-05-17 DIAGNOSIS — Z139 Encounter for screening, unspecified: Secondary | ICD-10-CM

## 2015-05-17 DIAGNOSIS — Z01419 Encounter for gynecological examination (general) (routine) without abnormal findings: Secondary | ICD-10-CM

## 2015-05-17 NOTE — Progress Notes (Signed)
Pt here for blood draw - fasting labs ordered 05/15/15

## 2015-05-20 ENCOUNTER — Telehealth: Payer: Self-pay | Admitting: *Deleted

## 2015-05-20 ENCOUNTER — Emergency Department (INDEPENDENT_AMBULATORY_CARE_PROVIDER_SITE_OTHER)
Admission: EM | Admit: 2015-05-20 | Discharge: 2015-05-20 | Disposition: A | Discharge status: Home / Self Care | Payer: BLUE CROSS/BLUE SHIELD | Source: Home / Self Care | Attending: Family Medicine | Admitting: Family Medicine

## 2015-05-20 ENCOUNTER — Encounter: Payer: Self-pay | Admitting: *Deleted

## 2015-05-20 DIAGNOSIS — T50995A Adverse effect of other drugs, medicaments and biological substances, initial encounter: Secondary | ICD-10-CM | POA: Diagnosis not present

## 2015-05-20 DIAGNOSIS — L270 Generalized skin eruption due to drugs and medicaments taken internally: Secondary | ICD-10-CM

## 2015-05-20 MED ORDER — TRIAMCINOLONE ACETONIDE 40 MG/ML IJ SUSP
40.0000 mg | Freq: Once | INTRAMUSCULAR | Status: AC
  Administered 2015-05-20: 40 mg via INTRAMUSCULAR

## 2015-05-20 NOTE — Telephone Encounter (Signed)
Pt returned call about her lab results - states she took her medications as we as her husbands and took niacin. She states she now has hives and itching. Advised pt to report to ED for eval. Pt expressed understanding.

## 2015-05-20 NOTE — ED Provider Notes (Signed)
CSN: [id number]     Arrival date & time 05/20/15  1204 History   First MD Initiated Contact with Patient 05/20/15 1229     Chief Complaint  Patient presents with  . Rash  . Medication Reaction      HPI Comments: Patient reports that she tried taking Niacin  today for high cholesterol.  30 minutes later she developed generalized burning rash, most prominent on arms.  No difficulty swallowing or shortness of breath.  She feels well otherwise,  Patient is a 45 y.o. female presenting with rash. The history is provided by the patient.  Rash Location: trunk and extremities. Quality: burning and itchiness   Severity:  Moderate Onset quality:  Sudden Duration:  1 hour Timing:  Constant Progression:  Unchanged Chronicity:  New Context: medications   Relieved by:  Nothing Worsened by:  Nothing tried Ineffective treatments:  None tried Associated symptoms: no abdominal pain, no diarrhea, no fatigue, no fever, no headaches, no hoarse voice, no induration, no joint pain, no myalgias, no nausea, no periorbital edema, no shortness of breath, no sore throat, no throat swelling, no tongue swelling and not wheezing     Past Medical History  Diagnosis Date  . Fibromyalgia   . Bipolar 2 disorder (HCC)   . Vocal cord polyp    History reviewed. No pertinent past surgical history. Family History  Problem Relation Age of Onset  . Hypertension Mother   . Cancer Father     prostate CA   Social History  Substance Use Topics  . Smoking status: Current Every Day Smoker  . Smokeless tobacco: None  . Alcohol Use: Yes   OB History    Gravida Para Term Preterm AB TAB SAB Ectopic Multiple Living   Review of Systems  Constitutional: Negative for fever and fatigue.  HENT: Negative for hoarse voice and sore throat.   Respiratory: Negative for shortness of breath and wheezing.   Gastrointestinal: Negative for nausea, abdominal pain and diarrhea.  Musculoskeletal: Negative  for myalgias and arthralgias.  Skin: Positive for rash.  Neurological: Negative for headaches.    Allergies  Lamotrigine; Prednisone; Prochlorperazine edisylate; Sulfonamide derivatives; Trazodone; and Vicodin  Home Medications   Prior to Admission medications   Medication Sig Start Date End Date Taking? Authorizing Provider  DULoxetine (CYMBALTA) 60 MG capsule Take 50 mg by mouth daily.     Historical Provider, MD  fluticasone (FLONASE) 50 MCG/ACT nasal spray 1-2 sprays per nostril daily for at least 2 weeks, then seasonally as needed 01/31/15   Junius Finner, PA-C  gabapentin (NEURONTIN) 300 MG capsule Take 300 mg by mouth 3 (three) times daily. 200 in the am,  pm    Historical Provider, MD  hydrocortisone-pramoxine (PROCTOFOAM HC) rectal foam Place 1 applicator rectally 2 (two) times daily. 05/15/15   Lesly Dukes, MD  Naproxen-Esomeprazole 500-20 MG TBEC Take 1 tablet by mouth 2 (two) times daily. Patient not taking: Reported on 05/15/2015 11/14/13   Monica Becton, MD  orphenadrine (NORFLEX) 100 MG tablet Reported on 05/15/2015 11/03/13   Historical Provider, MD  Oxcarbazepine (TRILEPTAL) 300 MG tablet Take 300 mg by mouth 2 (two) times daily.    Historical Provider, MD  temazepam (RESTORIL) 15 MG capsule Take 30 mg by mouth at bedtime as needed for sleep.     Historical Provider, MD  triamcinolone ointment (KENALOG) 0.5 % Apply 1 application topically 2 (two) times  daily. 05/15/15   Lesly DukesKelly H Leggett, MD   Meds Ordered and Administered this Visit   Medications  triamcinolone acetonide (KENALOG-40) injection 40 mg (not administered)    BP 134/90 mmHg  Pulse 85  Temp(Src) 98.2 F (36.8 C) (Oral)  Resp 16  Wt 154 lb (69.854 kg)  SpO2 100%  LMP 05/04/2015 No data found.   Physical Exam  Constitutional: She is oriented to person, place, and time. She appears well-developed and well-nourished. No distress.  HENT:  Head: Normocephalic.  Nose: Nose normal.    Mouth/Throat: Oropharynx is clear and moist.  Eyes: Conjunctivae are normal. Pupils are equal, round, and reactive to light.  Neck: Neck supple.  Cardiovascular: Normal heart sounds.   Pulmonary/Chest: Breath sounds normal.  Abdominal: There is no tenderness.  Musculoskeletal: She exhibits no edema.  Lymphadenopathy:    She has no cervical adenopathy.  Neurological: She is alert and oriented to person, place, and time.  Skin: Skin is warm and dry. Rash noted. Rash is macular.     Macular, erythematous confluent eruption on extremities and trunk.  No swelling or tenderness to palpation.  Nursing note and vitals reviewed.   ED Course  Procedures none  MDM   1. Allergic drug rash    Administered Kenalog 40mg  IM  May take Benadryl as needed for itching. Followup with PCP if not improved in about 3 days. Followup with PCP for lipid management.   Lattie HawStephen A Enrique Weiss, MD 05/23/15 2121

## 2015-05-20 NOTE — Telephone Encounter (Signed)
Called pt to adv all labs are normal except Lipid and needs to follow up with PCP. LMOM for pt to rtn call if needed.

## 2015-05-20 NOTE — Discharge Instructions (Signed)
May take Benadryl as needed for itching.   Drug Rash A drug rash is a change in the color or texture of the skin that is caused by a drug. It can develop minutes, hours, or days after the person takes the drug. CAUSES This condition is usually caused by a drug allergy. It can also be caused by exposure to sunlight after taking a drug that makes the skin sensitive to light. Drugs that commonly cause rashes include:  Penicillin.  Antibiotic medicines.  Medicines that treat seizures.  Medicines that treat cancer (chemotherapy).  Aspirin and other nonsteroidal anti-inflammatory drugs (NSAIDs).  Injectable dyes that contain iodine.  Insulin. SYMPTOMS Symptoms of this condition include:  Redness.  Tiny bumps.  Peeling.  Itching.  Itchy welts (hives).  Swelling. The rash may appear on a small area of skin or all over the body. DIAGNOSIS To diagnose the condition, your health care provider will do a physical exam. He or she may also order tests to find out which drug caused the rash. Tests to find the cause of a rash include:  Skin tests.  Blood tests.  Drug challenge. For this test, you stop taking all of the drugs that you do not need to take, and then you start taking them again by adding back one of the drugs at a time. TREATMENT A drug rash may be treated with medicines, including:  Antihistamines. These may be given to relieve itching.  An NSAID. This may be given to reduce swelling and treat pain.  A steroid drug. This may be given to reduce swelling. The rash usually goes away when the person stops taking the drug that caused it. HOME CARE INSTRUCTIONS  Take medicines only as directed by your health care provider.  Let all of your health care providers know about any drug reactions you have had in the past.  If you have hives, take a cool shower or use a cool compress to relieve itchiness. SEEK MEDICAL CARE IF:  You have a fever.  Your rash is not going  away.  Your rash gets worse.  Your rash comes back.  You have wheezing or coughing. SEEK IMMEDIATE MEDICAL CARE IF:  You start to have breathing problems.  You start to have shortness of breath.  You face or throat starts to swell.  You have severe weakness with dizziness or fainting.  You have chest pain.   This information is not intended to replace advice given to you by your health care provider. Make sure you discuss any questions you have with your health care provider.   Document Released: 03/19/2004 Document Revised: 03/02/2014 Document Reviewed: 12/06/2013 Elsevier Interactive Patient Education [employer]2016 [employer]

## 2015-05-20 NOTE — ED Notes (Addendum)
Pt reports taking Niacin for high cholesterol about 1 hour ago. 30 minutes later she developed the widespread burning rash and dry mouth. No dyspnea, CP or difficulty swallowing.

## 2015-05-22 LAB — COMPREHENSIVE METABOLIC PANEL
ALT: 10 U/L (ref 6–29)
AST: 17 U/L (ref 10–35)
Albumin: 4.1 g/dL (ref 3.6–5.1)
Alkaline Phosphatase: 48 U/L (ref 33–115)
BUN: 9 mg/dL (ref 7–25)
CHLORIDE: 100 mmol/L (ref 98–110)
CO2: 29 mmol/L (ref 20–31)
CREATININE: 0.7 mg/dL (ref 0.50–1.10)
Calcium: 8.7 mg/dL (ref 8.6–10.2)
GLUCOSE: 81 mg/dL (ref 65–99)
POTASSIUM: 4.3 mmol/L (ref 3.5–5.3)
SODIUM: 138 mmol/L (ref 135–146)
Total Bilirubin: 0.6 mg/dL (ref 0.2–1.2)
Total Protein: 6.3 g/dL (ref 6.1–8.1)

## 2015-05-22 LAB — LIPID PANEL
Cholesterol: 202 mg/dL — ABNORMAL HIGH (ref 125–200)
HDL: 102 mg/dL (ref >=46)
LDL CALC: 89 mg/dL (ref <130)
Total CHOL/HDL Ratio: 2 Ratio (ref <=5.0)
Triglycerides: 55 mg/dL (ref <150)
VLDL: 11 mg/dL (ref <30)

## 2015-05-23 ENCOUNTER — Other Ambulatory Visit: Payer: BLUE CROSS/BLUE SHIELD

## 2015-05-23 DIAGNOSIS — Z01419 Encounter for gynecological examination (general) (routine) without abnormal findings: Secondary | ICD-10-CM

## 2015-05-23 LAB — LIPID PANEL
Cholesterol: 179 mg/dL (ref 125–200)
HDL: 96 mg/dL (ref >=46)
LDL Cholesterol: 72 mg/dL (ref <130)
TRIGLYCERIDES: 54 mg/dL (ref <150)
Total CHOL/HDL Ratio: 1.9 Ratio (ref <=5.0)
VLDL: 11 mg/dL (ref <30)

## 2015-05-23 NOTE — Progress Notes (Signed)
Pt here for a redraw of her Lipids.  The lab had originally resulted her Triglycerides as 343 then ammended the report to 55.  Pt when gotten the original report googled ways to decrease her triglycerides and took Niacin.  She ended up getting severe allergic reaction and went to UC who gave her a shot to help with the hives.  Per Dr Penne LashLeggett she wants this redrawn and ran at no cost to the patient.

## 2015-05-24 ENCOUNTER — Telehealth: Payer: Self-pay

## 2015-05-24 NOTE — Telephone Encounter (Signed)
Left message for patient that lipid panel was within normal limits. Patient instructed to call back if she has any further questions. Armandina StammerJennifer Analysse Quinonez RN BSN

## 2016-08-31 ENCOUNTER — Other Ambulatory Visit: Payer: Self-pay | Admitting: Obstetrics & Gynecology

## 2016-08-31 DIAGNOSIS — Z1239 Encounter for other screening for malignant neoplasm of breast: Secondary | ICD-10-CM

## 2016-09-04 ENCOUNTER — Ambulatory Visit (INDEPENDENT_AMBULATORY_CARE_PROVIDER_SITE_OTHER): Payer: Managed Care, Other (non HMO)

## 2016-09-04 DIAGNOSIS — Z1231 Encounter for screening mammogram for malignant neoplasm of breast: Secondary | ICD-10-CM | POA: Diagnosis not present

## 2016-09-04 DIAGNOSIS — Z1239 Encounter for other screening for malignant neoplasm of breast: Secondary | ICD-10-CM

## 2016-11-03 ENCOUNTER — Encounter: Payer: Self-pay | Admitting: Obstetrics and Gynecology

## 2016-11-03 ENCOUNTER — Ambulatory Visit (INDEPENDENT_AMBULATORY_CARE_PROVIDER_SITE_OTHER): Payer: 59 | Admitting: Obstetrics and Gynecology

## 2016-11-03 VITALS — BP 138/82 | HR 85 | Ht 64.75 in | Wt 151.0 lb

## 2016-11-03 DIAGNOSIS — N760 Acute vaginitis: Secondary | ICD-10-CM | POA: Diagnosis not present

## 2016-11-03 DIAGNOSIS — Z113 Encounter for screening for infections with a predominantly sexual mode of transmission: Secondary | ICD-10-CM

## 2016-11-03 MED ORDER — NYSTATIN-TRIAMCINOLONE 100000-0.1 UNIT/GM-% EX OINT: 1.0000 "application " | TOPICAL_OINTMENT | Freq: Two times a day (BID) | CUTANEOUS | 0 refills | Status: AC

## 2016-11-03 NOTE — Progress Notes (Signed)
46 yo [id number] here for the evaluation of acute vaginitis. Patient reports onset of vaginal pruritis 2 weeks ago which she treated with a 7-day course of monistat with some improvement in her symptoms. Patient reports vaginal pruritis has returned and it is now painful to urinate, wash or wear underwear. Patient is sexually active with the same partner. She denies any pelvic pain  Past Medical History:  Diagnosis Date  . Bipolar 2 disorder (HCC)   . Fibromyalgia   . Vocal cord polyp    History reviewed. No pertinent surgical history. Family History  Problem Relation Age of Onset  . Hypertension Mother   . Cancer Father        prostate CA   Social History  Substance Use Topics  . Smoking status: Current Every Day Smoker  . Smokeless tobacco: Never Used  . Alcohol use Yes    ROS See pertinent in HPI  Blood pressure 138/82, pulse 85, height 5' 4.75" (1.645 m), weight 151 lb (68.5 kg). GENERAL: Well-developed, well-nourished female in no acute distress.  PELVIC: Normal external female genitalia with several areas of excoriations involving labia majora and perirectal area. Vagina is pink and rugated.  Normal discharge. Normal appearing cervix. Uterus is normal in size. No adnexal mass or tenderness. EXTREMITIES: No cyanosis, clubbing, or edema, 2+ distal pulses.  A/P 46 yo with acute vaginitis - wet prep collected - Rx nystatin cream provided - Patient will be contacted with results - UA, Ucx also collected

## 2016-11-03 NOTE — Progress Notes (Signed)
Patient complaining of vaginal burning for two weeks. Patient state she tried a Monistat 7 days.[id number]

## 2016-11-05 LAB — CERVICOVAGINAL ANCILLARY ONLY
Bacterial vaginitis: NEGATIVE
Candida vaginitis: NEGATIVE
Chlamydia: NEGATIVE
Neisseria Gonorrhea: NEGATIVE
TRICH (WINDOWPATH): NEGATIVE

## 2016-11-06 ENCOUNTER — Telehealth: Payer: Self-pay

## 2016-11-06 NOTE — Telephone Encounter (Signed)
Spoke with pt and she is aware of negative BV and yeast infection results. I reccommended that she continue to use the cream that Dr.Constant gave her to help with vulva irritation. Pt expressed understanding.

## 2016-11-09 ENCOUNTER — Telehealth: Payer: Self-pay | Admitting: *Deleted

## 2016-11-09 NOTE — Telephone Encounter (Signed)
Left message on cell phone that her cultures were neg.  Instructed to call office if any questions.

## 2016-11-09 NOTE — Telephone Encounter (Signed)
-----   Message from Catalina Antigua, MD sent at 11/05/2016  4:00 PM EDT ----- Please inform patient of negative BV and yeast infection. I truly hope she finds relief of her vulva irritation with the prescribed cream  Thanks  Peggy

## 2016-11-10 ENCOUNTER — Encounter: Payer: Self-pay | Admitting: Physician Assistant

## 2016-11-10 ENCOUNTER — Ambulatory Visit (INDEPENDENT_AMBULATORY_CARE_PROVIDER_SITE_OTHER): Payer: 59

## 2016-11-10 ENCOUNTER — Ambulatory Visit (INDEPENDENT_AMBULATORY_CARE_PROVIDER_SITE_OTHER): Payer: 59 | Admitting: Physician Assistant

## 2016-11-10 VITALS — BP 106/69 | HR 79 | Ht 64.75 in | Wt 151.0 lb

## 2016-11-10 DIAGNOSIS — J329 Chronic sinusitis, unspecified: Secondary | ICD-10-CM

## 2016-11-10 DIAGNOSIS — F17200 Nicotine dependence, unspecified, uncomplicated: Secondary | ICD-10-CM

## 2016-11-10 DIAGNOSIS — R05 Cough: Secondary | ICD-10-CM

## 2016-11-10 DIAGNOSIS — R059 Cough, unspecified: Secondary | ICD-10-CM

## 2016-11-10 DIAGNOSIS — J40 Bronchitis, not specified as acute or chronic: Secondary | ICD-10-CM | POA: Diagnosis not present

## 2016-11-10 DIAGNOSIS — R42 Dizziness and giddiness: Secondary | ICD-10-CM | POA: Diagnosis not present

## 2016-11-10 MED ORDER — METHYLPREDNISOLONE ACETATE 40 MG/ML IJ SUSP
40.0000 mg | Freq: Once | INTRAMUSCULAR | Status: AC
  Administered 2016-11-10: 40 mg via INTRAMUSCULAR

## 2016-11-10 MED ORDER — IPRATROPIUM-ALBUTEROL 0.5-2.5 (3) MG/3ML IN SOLN
3.0000 mL | Freq: Once | RESPIRATORY_TRACT | Status: AC
  Administered 2016-11-10: 3 mL via RESPIRATORY_TRACT

## 2016-11-10 MED ORDER — METHYLPREDNISOLONE SODIUM SUCC 125 MG IJ SOLR
125.0000 mg | Freq: Once | INTRAMUSCULAR | Status: AC
  Administered 2016-11-10: 125 mg via INTRAMUSCULAR

## 2016-11-10 NOTE — Progress Notes (Signed)
   Subjective:    Patient ID: Tracey Bender, female    DOB: 1970-05-11, 46 y.o.   MRN: [id number]  HPI  Pt is a 46 yo female who presents to the clinic with 5 weeks of productive cough, off and on hoarsness, sinus pressure and congestion. She has been using a lot of natural remedies such as oregeno/tumeric and benadryl at bedtime. She wants to be able to sing this weekend to start practicing for christmas carols. She has some pain with deep breathing and that concerns her. She has had pleursiy before.   .. Active Ambulatory Problems    Diagnosis Date Noted  . THRUSH 09/09/2010  . Fibromyalgia 11/03/2013  . Bipolar II disorder (HCC) 11/03/2013  . Serotonin syndrome 11/03/2013  . Osteoarthritis of right knee 11/06/2013  . Right lumbar radiculitis 11/14/2013  . Hoarseness of voice 05/13/2015  . Dizziness 11/10/2016  . Current smoker 11/10/2016   Resolved Ambulatory Problems    Diagnosis Date Noted  . Right knee pain 11/06/2013   Past Medical History:  Diagnosis Date  . Bipolar 2 disorder (HCC)   . Fibromyalgia   . Vocal cord polyp       Review of Systems  All other systems reviewed and are negative.      Objective:   Physical Exam  Constitutional: She is oriented to person, place, and time. She appears well-developed and well-nourished.  HENT:  Head: Normocephalic and atraumatic.  Right Ear: External ear normal.  Left Ear: External ear normal.  Nose: Nose normal.  Mouth/Throat: No oropharyngeal exudate.  Sinus fullness but no pain to palpation.  Oropharynx erythematous with PND. No tonsil swelling.   Eyes: Conjunctivae are normal.  Neck: Normal range of motion. Neck supple. No thyromegaly present.  Cardiovascular: Normal rate, regular rhythm and normal heart sounds.   Pulmonary/Chest: Effort normal and breath sounds normal. She has no wheezes. She has no rales. She exhibits tenderness.  Tenderness bilateral lower ribs.  Pectus excavatum noted.   Lymphadenopathy:   She has cervical adenopathy.  Neurological: She is alert and oriented to person, place, and time.  Psychiatric: She has a normal mood and affect. Her behavior is normal.          Assessment & Plan:  Marland KitchenMarland KitchenDiagnoses and all orders for this visit:  Cough -     DG Chest 2 View -     ipratropium-albuterol (DUONEB) 0.5-2.5 (3) MG/3ML nebulizer solution 3 mL; Take 3 mLs by nebulization once. -     methylPREDNISolone sodium succinate (SOLU-MEDROL) 125 mg/2 mL injection 125 mg; Inject 2 mLs (125 mg total) into the muscle once. -     methylPREDNISolone acetate (DEPO-MEDROL) injection 40 mg; Inject 1 mL (40 mg total) into the muscle once.  Current smoker -     DG Chest 2 View  Dizziness  Sinobronchitis -     methylPREDNISolone sodium succinate (SOLU-MEDROL) 125 mg/2 mL injection 125 mg; Inject 2 mLs (125 mg total) into the muscle once. -     methylPREDNISolone acetate (DEPO-MEDROL) injection 40 mg; Inject 1 mL (40 mg total) into the muscle once.   CXR normal.  No overt signs of bacterial infection.  Suspect more allergic/viral. Treated with prednisone. Oral prednisone effects her biploar.  Certainly I feel like she has some costochondritis from coughing.  Consider adding zyrtec/claritin daily.  Follow up as needed. If sinus symptoms continue will treat with abx.

## 2016-11-10 NOTE — Progress Notes (Signed)
Call pt: lungs are clear. No edema or consolidation.

## 2016-11-10 NOTE — Patient Instructions (Signed)
Costochondritis Costochondritis is swelling and irritation (inflammation) of the tissue (cartilage) that connects your ribs to your breastbone (sternum). This causes pain in the front of your chest. Usually, the pain:  Starts gradually.  Is in more than one rib.  This condition usually goes away on its own over time. Follow these instructions at home:  Do not do anything that makes your pain worse.  If directed, put ice on the painful area: ? Put ice in a plastic bag. ? Place a towel between your skin and the bag. ? Leave the ice on for 20 minutes, 2-3 times a day.  If directed, put heat on the affected area as often as told by your doctor. Use the heat source that your doctor tells you to use, such as a moist heat pack or a heating pad. ? Place a towel between your skin and the heat source. ? Leave the heat on for 20-30 minutes. ? Take off the heat if your skin turns bright red. This is very important if you cannot feel pain, heat, or cold. You may have a greater risk of getting burned.  Take over-the-counter and prescription medicines only as told by your doctor.  Return to your normal activities as told by your doctor. Ask your doctor what activities are safe for you.  Keep all follow-up visits as told by your doctor. This is important. Contact a doctor if:  You have chills or a fever.  Your pain does not go away or it gets worse.  You have a cough that does not go away. Get help right away if:  You are short of breath. This information is not intended to replace advice given to you by your health care provider. Make sure you discuss any questions you have with your health care provider. Document Released: 07/29/2007 Document Revised: 08/30/2015 Document Reviewed: 06/05/2015 [employer] Interactive Patient Education  2018 [employer]  

## 2016-11-12 ENCOUNTER — Encounter: Payer: Self-pay | Admitting: Physician Assistant
